# Patient Record
Sex: Female | Born: 1960 | Race: Black or African American | Hispanic: No | Marital: Married | Smoking: Never smoker
Health system: Southern US, Community
[De-identification: ages and names within clinical notes are randomized; demographics above are authoritative.]

## PROBLEM LIST (undated history)

## (undated) DIAGNOSIS — R002 Palpitations: Secondary | ICD-10-CM

## (undated) DIAGNOSIS — G4700 Insomnia, unspecified: Secondary | ICD-10-CM

## (undated) DIAGNOSIS — F513 Sleepwalking [somnambulism]: Secondary | ICD-10-CM

## (undated) DIAGNOSIS — E785 Hyperlipidemia, unspecified: Secondary | ICD-10-CM

## (undated) DIAGNOSIS — R011 Cardiac murmur, unspecified: Secondary | ICD-10-CM

## (undated) DIAGNOSIS — M1990 Unspecified osteoarthritis, unspecified site: Secondary | ICD-10-CM

## (undated) DIAGNOSIS — F419 Anxiety disorder, unspecified: Secondary | ICD-10-CM

## (undated) DIAGNOSIS — I341 Nonrheumatic mitral (valve) prolapse: Secondary | ICD-10-CM

## (undated) DIAGNOSIS — H269 Unspecified cataract: Secondary | ICD-10-CM

## (undated) HISTORY — DX: Unspecified osteoarthritis, unspecified site: M19.90

## (undated) HISTORY — DX: Cardiac murmur, unspecified: R01.1

## (undated) HISTORY — DX: Palpitations: R00.2

## (undated) HISTORY — DX: Unspecified cataract: H26.9

## (undated) HISTORY — DX: Insomnia, unspecified: G47.00

## (undated) HISTORY — DX: Nonrheumatic mitral (valve) prolapse: I34.1

## (undated) HISTORY — DX: Hyperlipidemia, unspecified: E78.5

## (undated) HISTORY — PX: FOOT SURGERY: SHX648

## (undated) HISTORY — DX: Anxiety disorder, unspecified: F41.9

## (undated) HISTORY — DX: Sleepwalking (somnambulism): F51.3

---

## 1998-06-29 ENCOUNTER — Other Ambulatory Visit: Admission: RE | Admit: 1998-06-29 | Discharge: 1998-06-29 | Payer: Self-pay | Admitting: Obstetrics and Gynecology

## 1999-07-01 ENCOUNTER — Other Ambulatory Visit: Admission: RE | Admit: 1999-07-01 | Discharge: 1999-07-01 | Payer: Self-pay | Admitting: Obstetrics and Gynecology

## 2000-07-31 ENCOUNTER — Other Ambulatory Visit: Admission: RE | Admit: 2000-07-31 | Discharge: 2000-07-31 | Payer: Self-pay | Admitting: Obstetrics and Gynecology

## 2001-04-18 ENCOUNTER — Ambulatory Visit (HOSPITAL_COMMUNITY): Admission: RE | Admit: 2001-04-18 | Discharge: 2001-04-18 | Payer: Self-pay | Admitting: Obstetrics and Gynecology

## 2001-04-18 ENCOUNTER — Encounter: Payer: Self-pay | Admitting: Obstetrics and Gynecology

## 2002-10-24 ENCOUNTER — Other Ambulatory Visit: Admission: RE | Admit: 2002-10-24 | Discharge: 2002-10-24 | Payer: Self-pay | Admitting: Obstetrics and Gynecology

## 2003-11-20 ENCOUNTER — Ambulatory Visit (HOSPITAL_COMMUNITY): Admission: RE | Admit: 2003-11-20 | Discharge: 2003-11-20 | Payer: Self-pay | Admitting: Internal Medicine

## 2004-11-29 ENCOUNTER — Other Ambulatory Visit: Admission: RE | Admit: 2004-11-29 | Discharge: 2004-11-29 | Payer: Self-pay | Admitting: Internal Medicine

## 2004-11-30 ENCOUNTER — Encounter: Admission: RE | Admit: 2004-11-30 | Discharge: 2004-11-30 | Payer: Self-pay | Admitting: Internal Medicine

## 2004-11-30 ENCOUNTER — Ambulatory Visit (HOSPITAL_COMMUNITY): Admission: RE | Admit: 2004-11-30 | Discharge: 2004-11-30 | Payer: Self-pay | Admitting: Internal Medicine

## 2005-01-12 ENCOUNTER — Encounter: Admission: RE | Admit: 2005-01-12 | Discharge: 2005-01-12 | Payer: Self-pay | Admitting: Internal Medicine

## 2005-12-07 ENCOUNTER — Ambulatory Visit (HOSPITAL_COMMUNITY): Admission: RE | Admit: 2005-12-07 | Discharge: 2005-12-07 | Payer: Self-pay | Admitting: Internal Medicine

## 2006-02-10 ENCOUNTER — Other Ambulatory Visit: Admission: RE | Admit: 2006-02-10 | Discharge: 2006-02-10 | Payer: Self-pay | Admitting: Internal Medicine

## 2006-05-09 ENCOUNTER — Other Ambulatory Visit: Admission: RE | Admit: 2006-05-09 | Discharge: 2006-05-09 | Payer: Self-pay | Admitting: Internal Medicine

## 2006-12-08 ENCOUNTER — Ambulatory Visit (HOSPITAL_COMMUNITY): Admission: RE | Admit: 2006-12-08 | Discharge: 2006-12-08 | Payer: Self-pay | Admitting: Internal Medicine

## 2007-12-13 HISTORY — PX: SHOULDER SURGERY: SHX246

## 2008-01-25 ENCOUNTER — Ambulatory Visit (HOSPITAL_COMMUNITY): Admission: RE | Admit: 2008-01-25 | Discharge: 2008-01-25 | Payer: Self-pay | Admitting: Obstetrics and Gynecology

## 2008-01-29 ENCOUNTER — Ambulatory Visit: Payer: Self-pay | Admitting: Sports Medicine

## 2008-01-29 DIAGNOSIS — M719 Bursopathy, unspecified: Secondary | ICD-10-CM

## 2008-01-29 DIAGNOSIS — M67919 Unspecified disorder of synovium and tendon, unspecified shoulder: Secondary | ICD-10-CM | POA: Insufficient documentation

## 2008-01-29 DIAGNOSIS — M12819 Other specific arthropathies, not elsewhere classified, unspecified shoulder: Secondary | ICD-10-CM

## 2008-01-30 ENCOUNTER — Encounter: Payer: Self-pay | Admitting: Sports Medicine

## 2009-01-26 ENCOUNTER — Ambulatory Visit (HOSPITAL_COMMUNITY): Admission: RE | Admit: 2009-01-26 | Discharge: 2009-01-26 | Payer: Self-pay | Admitting: Obstetrics and Gynecology

## 2009-05-06 ENCOUNTER — Encounter (INDEPENDENT_AMBULATORY_CARE_PROVIDER_SITE_OTHER): Payer: Self-pay | Admitting: *Deleted

## 2009-05-25 ENCOUNTER — Ambulatory Visit: Payer: Self-pay | Admitting: Family Medicine

## 2009-05-25 DIAGNOSIS — F411 Generalized anxiety disorder: Secondary | ICD-10-CM | POA: Insufficient documentation

## 2009-05-25 DIAGNOSIS — G478 Other sleep disorders: Secondary | ICD-10-CM | POA: Insufficient documentation

## 2009-05-25 DIAGNOSIS — M25579 Pain in unspecified ankle and joints of unspecified foot: Secondary | ICD-10-CM

## 2009-07-22 ENCOUNTER — Ambulatory Visit: Payer: Self-pay | Admitting: Family Medicine

## 2009-07-22 ENCOUNTER — Other Ambulatory Visit: Admission: RE | Admit: 2009-07-22 | Discharge: 2009-07-22 | Payer: Self-pay | Admitting: Family Medicine

## 2009-07-22 ENCOUNTER — Encounter: Payer: Self-pay | Admitting: Family Medicine

## 2009-07-22 DIAGNOSIS — B373 Candidiasis of vulva and vagina: Secondary | ICD-10-CM

## 2009-07-22 LAB — CONVERTED CEMR LAB: Whiff Test: NEGATIVE

## 2009-07-23 ENCOUNTER — Telehealth (INDEPENDENT_AMBULATORY_CARE_PROVIDER_SITE_OTHER): Payer: Self-pay | Admitting: *Deleted

## 2009-07-23 LAB — CONVERTED CEMR LAB
ALT: 15 units/L (ref 0–35)
AST: 16 units/L (ref 0–37)
Albumin: 3.7 g/dL (ref 3.5–5.2)
Alkaline Phosphatase: 53 units/L (ref 39–117)
BUN: 11 mg/dL (ref 6–23)
Basophils Absolute: 0 10*3/uL (ref 0.0–0.1)
Basophils Relative: 0.5 % (ref 0.0–3.0)
Bilirubin, Direct: 0 mg/dL (ref 0.0–0.3)
CO2: 27 meq/L (ref 19–32)
Calcium: 9.3 mg/dL (ref 8.4–10.5)
Chloride: 109 meq/L (ref 96–112)
Cholesterol: 202 mg/dL — ABNORMAL HIGH (ref 0–200)
Creatinine, Ser: 0.8 mg/dL (ref 0.4–1.2)
Direct LDL: 150.3 mg/dL
Eosinophils Absolute: 0.1 10*3/uL (ref 0.0–0.7)
Eosinophils Relative: 2 % (ref 0.0–5.0)
GFR calc non Af Amer: 98.38 mL/min (ref >60)
Glucose, Bld: 80 mg/dL (ref 70–99)
HCT: 37.2 % (ref 36.0–46.0)
HDL: 51.3 mg/dL (ref >39.00)
Hemoglobin: 12.2 g/dL (ref 12.0–15.0)
Lymphocytes Relative: 30.7 % (ref 12.0–46.0)
Lymphs Abs: 1.2 10*3/uL (ref 0.7–4.0)
MCHC: 32.9 g/dL (ref 30.0–36.0)
MCV: 86.1 fL (ref 78.0–100.0)
Monocytes Absolute: 0.3 10*3/uL (ref 0.1–1.0)
Monocytes Relative: 8.7 % (ref 3.0–12.0)
Neutro Abs: 2.4 10*3/uL (ref 1.4–7.7)
Neutrophils Relative %: 58.1 % (ref 43.0–77.0)
Platelets: 249 10*3/uL (ref 150.0–400.0)
Potassium: 4.4 meq/L (ref 3.5–5.1)
RBC: 4.33 M/uL (ref 3.87–5.11)
RDW: 15.5 % — ABNORMAL HIGH (ref 11.5–14.6)
Sodium: 140 meq/L (ref 135–145)
TSH: 1.42 microintl units/mL (ref 0.35–5.50)
Total Bilirubin: 0.5 mg/dL (ref 0.3–1.2)
Total CHOL/HDL Ratio: 4
Total Protein: 7.1 g/dL (ref 6.0–8.3)
Triglycerides: 49 mg/dL (ref 0.0–149.0)
VLDL: 9.8 mg/dL (ref 0.0–40.0)
WBC: 4 10*3/uL — ABNORMAL LOW (ref 4.5–10.5)

## 2009-07-24 ENCOUNTER — Telehealth (INDEPENDENT_AMBULATORY_CARE_PROVIDER_SITE_OTHER): Payer: Self-pay | Admitting: *Deleted

## 2009-07-28 ENCOUNTER — Encounter (INDEPENDENT_AMBULATORY_CARE_PROVIDER_SITE_OTHER): Payer: Self-pay | Admitting: *Deleted

## 2009-09-29 ENCOUNTER — Telehealth (INDEPENDENT_AMBULATORY_CARE_PROVIDER_SITE_OTHER): Payer: Self-pay | Admitting: *Deleted

## 2009-10-23 ENCOUNTER — Telehealth (INDEPENDENT_AMBULATORY_CARE_PROVIDER_SITE_OTHER): Payer: Self-pay | Admitting: *Deleted

## 2009-12-28 ENCOUNTER — Ambulatory Visit: Payer: Self-pay | Admitting: Family

## 2009-12-28 DIAGNOSIS — H6690 Otitis media, unspecified, unspecified ear: Secondary | ICD-10-CM | POA: Insufficient documentation

## 2009-12-28 DIAGNOSIS — J329 Chronic sinusitis, unspecified: Secondary | ICD-10-CM | POA: Insufficient documentation

## 2010-02-05 ENCOUNTER — Ambulatory Visit (HOSPITAL_COMMUNITY): Admission: RE | Admit: 2010-02-05 | Discharge: 2010-02-05 | Payer: Self-pay | Admitting: Family Medicine

## 2010-02-05 LAB — HM MAMMOGRAPHY: HM Mammogram: NORMAL

## 2010-05-17 ENCOUNTER — Telehealth (INDEPENDENT_AMBULATORY_CARE_PROVIDER_SITE_OTHER): Payer: Self-pay | Admitting: *Deleted

## 2010-05-17 ENCOUNTER — Ambulatory Visit: Payer: Self-pay | Admitting: Family Medicine

## 2010-05-17 DIAGNOSIS — R209 Unspecified disturbances of skin sensation: Secondary | ICD-10-CM

## 2010-07-23 ENCOUNTER — Ambulatory Visit: Payer: Self-pay | Admitting: Family Medicine

## 2010-07-23 DIAGNOSIS — H9190 Unspecified hearing loss, unspecified ear: Secondary | ICD-10-CM | POA: Insufficient documentation

## 2010-07-23 DIAGNOSIS — K219 Gastro-esophageal reflux disease without esophagitis: Secondary | ICD-10-CM | POA: Insufficient documentation

## 2010-07-27 LAB — CONVERTED CEMR LAB
ALT: 10 units/L (ref 0–35)
AST: 16 units/L (ref 0–37)
Albumin: 3.7 g/dL (ref 3.5–5.2)
Basophils Absolute: 0 10*3/uL (ref 0.0–0.1)
Chloride: 110 meq/L (ref 96–112)
Eosinophils Relative: 1.8 % (ref 0.0–5.0)
GFR calc non Af Amer: 108.89 mL/min (ref >60)
Glucose, Bld: 77 mg/dL (ref 70–99)
HCT: 33 % — ABNORMAL LOW (ref 36.0–46.0)
HDL: 60.5 mg/dL (ref >39.00)
Hemoglobin: 10.7 g/dL — ABNORMAL LOW (ref 12.0–15.0)
Lymphs Abs: 1.2 10*3/uL (ref 0.7–4.0)
MCV: 82.2 fL (ref 78.0–100.0)
Monocytes Relative: 7.5 % (ref 3.0–12.0)
Neutro Abs: 2.9 10*3/uL (ref 1.4–7.7)
Potassium: 4.5 meq/L (ref 3.5–5.1)
RDW: 18.2 % — ABNORMAL HIGH (ref 11.5–14.6)
Sodium: 143 meq/L (ref 135–145)
TSH: 1.9 microintl units/mL (ref 0.35–5.50)

## 2010-07-28 ENCOUNTER — Telehealth (INDEPENDENT_AMBULATORY_CARE_PROVIDER_SITE_OTHER): Payer: Self-pay | Admitting: *Deleted

## 2010-08-02 ENCOUNTER — Encounter: Payer: Self-pay | Admitting: Family Medicine

## 2010-08-27 ENCOUNTER — Ambulatory Visit: Payer: Self-pay | Admitting: Family Medicine

## 2010-08-27 ENCOUNTER — Other Ambulatory Visit: Admission: RE | Admit: 2010-08-27 | Discharge: 2010-08-27 | Payer: Self-pay | Admitting: Family Medicine

## 2010-08-27 DIAGNOSIS — D485 Neoplasm of uncertain behavior of skin: Secondary | ICD-10-CM

## 2010-08-27 DIAGNOSIS — E785 Hyperlipidemia, unspecified: Secondary | ICD-10-CM

## 2010-09-01 ENCOUNTER — Telehealth: Payer: Self-pay | Admitting: Family Medicine

## 2010-10-12 ENCOUNTER — Ambulatory Visit: Payer: Self-pay | Admitting: Family Medicine

## 2010-10-13 LAB — CONVERTED CEMR LAB
ALT: 12 units/L (ref 0–35)
AST: 17 units/L (ref 0–37)
Albumin: 3.6 g/dL (ref 3.5–5.2)

## 2011-01-02 ENCOUNTER — Encounter: Payer: Self-pay | Admitting: Internal Medicine

## 2011-01-13 NOTE — Assessment & Plan Note (Signed)
Summary: cpx - lab/cbs--Rm 16   Vital Signs:  Patient profile:   50 year old female LMP:     07/19/2010 Height:      66 inches Weight:      203 pounds BMI:     32.88 Temp:     98.1 degrees F oral Pulse rate:   72 / minute Pulse rhythm:   regular Resp:     16 per minute BP sitting:   128 / 80  (left arm) Cuff size:   regular  Vitals Entered By: Mervin Kung CMA Duncan Dull) (July 23, 2010 8:27 AM)  Does patient need assistance? Functional Status Shopping Comments Has appt with Neuro next month for right side facial numbness. Pt states she had "profuse vaginal discharge" when she used Metrogel.  Nicki Guadalajara Fergerson CMA Duncan Dull)  July 23, 2010 8:34 AM  LMP (date): 07/19/2010     Enter LMP: 07/19/2010 Last PAP Result NEGATIVE FOR INTRAEPITHELIAL LESIONS OR MALIGNANCY.   History of Present Illness: 50 yo woman here today for CPE.  currently menstruating- no pap.  1) facial numbness- no additional episodes, having some numbness in fingertips.  has neuro appt pending.  2) abd pain- reports sxs at night, 'it's like a hunger pain and my stomach starts churning'.  took prevacid for 4 days and sxs improved.  no N/V/D.  Preventive Screening-Counseling & Management  Alcohol-Tobacco     Alcohol drinks/day: <1     Smoking Status: never  Caffeine-Diet-Exercise     Does Patient Exercise: no      Sexual History:  currently monogamous.        Drug Use:  never.    Current Medications (verified): 1)  Alprazolam 0.5 Mg Tabs (Alprazolam) .Marland Kitchen.. 1 Tab Three Times A Day As Needed.  Allergies: 1)  ! Macrobid  Past History:  Past Medical History: Last updated: 05/25/2009 MVP Insomnia Sleep walking  Family History: Last updated: 05/25/2009 Both parents with arthritis. No history of Rheumatological disease. No colon cancer, breast cancer  F - CAD, quadruple bypass  Social History: Last updated: 05/25/2009 Married 1 child, 61 works in food services No alcohol No  drugs  Social History: Does Patient Exercise:  no  Review of Systems       The patient complains of decreased hearing and abdominal pain.  The patient denies anorexia, fever, weight loss, weight gain, vision loss, hoarseness, chest pain, syncope, dyspnea on exertion, peripheral edema, prolonged cough, headaches, melena, hematochezia, severe indigestion/heartburn, hematuria, suspicious skin lesions, depression, abnormal bleeding, enlarged lymph nodes, and breast masses.         feels her hearing loss is related to noisy work environment- would like audiology evaluation  Physical Exam  General:  Well-developed,well-nourished,in no acute distress; alert,appropriate and cooperative throughout examination Head:  Normocephalic and atraumatic without obvious abnormalities. No apparent alopecia or balding.  face is symmetric Eyes:  PERRL, EOMI, fundi WNL Ears:  External ear exam shows no significant lesions or deformities.  Otoscopic examination reveals clear canals, tympanic membranes are intact bilaterally without bulging, retraction, inflammation or discharge. Hearing is grossly normal bilaterally. Nose:  External nasal examination shows no deformity or inflammation. Nasal mucosa are pink and moist without lesions or exudates. Mouth:  Oral mucosa and oropharynx without lesions or exudates.  Teeth in good repair. Neck:  No deformities, masses, or tenderness noted.  no bruits Lungs:  Normal respiratory effort, chest expands symmetrically. Lungs are clear to auscultation, no crackles or wheezes. Heart:  Normal rate and regular rhythm.  S1 and S2 normal without gallop, murmur, click, rub or other extra sounds. Abdomen:  Bowel sounds positive,abdomen soft and non-tender without masses, organomegaly or hernias noted. Pulses:  +2 carotid, radial, DP Extremities:  No clubbing, cyanosis, edema, or deformity noted with normal full range of motion of all joints.   Neurologic:  alert & oriented X3, cranial  nerves II-XII intact, strength normal in all extremities, sensation intact to light touch, gait normal, and DTRs symmetrical and normal.  sensation symmetric on face Skin:  Intact without suspicious lesions or rashes.  Cervical Nodes:  No lymphadenopathy noted Axillary Nodes:  No palpable lymphadenopathy Psych:  Cognition and judgment appear intact. Alert and cooperative with normal attention span and concentration. No apparent delusions, illusions, hallucinations   Impression & Recommendations:  Problem # 1:  PHYSICAL EXAMINATION (ICD-V70.0) Assessment Unchanged pt's PE WNL.  will defer pap and breast exam due to pt's current menses.  check labs.  UTD on mammogram.  anticipatory guidance provided. Orders: Venipuncture (82956) TLB-Lipid Panel (80061-LIPID) TLB-BMP (Basic Metabolic Panel-BMET) (80048-METABOL) TLB-CBC Platelet - w/Differential (85025-CBCD) TLB-Hepatic/Liver Function Pnl (80076-HEPATIC) TLB-TSH (Thyroid Stimulating Hormone) (84443-TSH) T-Vitamin D (25-Hydroxy) (21308-65784) Specimen Handling (69629)  Problem # 2:  GERD (ICD-530.81) Assessment: New start OTC Prilosec daily.  reviewed lifestyle modifications.  Problem # 3:  UNSPECIFIED HEARING LOSS (ICD-389.9) Assessment: New  pt reports this is noise related.  would like formal evaluation.  Orders: Audiology (Audio)  Complete Medication List: 1)  Alprazolam 0.5 Mg Tabs (Alprazolam) .Marland Kitchen.. 1 tab three times a day as needed.  Patient Instructions: 1)  Please schedule your pap and breast exam at your convenience 2)  Start Prilosec (over the counter) for the acid reflux symptoms 3)  Try and get regular exercise 4)  We'll notify you of your lab results 5)  Call with any questions or concerns 6)  Good Luck w/ Back to School!!  (you'll get through it!)  Current Allergies (reviewed today): ! MACROBID     Vital Signs:  Patient Profile:   50 year old female LMP:     07/19/2010 Height:     66 inches Weight:       203 pounds BMI:     32.88 Temp:     98.1 degrees F oral Pulse rate:   72 / minute Pulse rhythm:   regular Resp:     16 per minute BP sitting:   128 / 80 Cuff size:   regular

## 2011-01-13 NOTE — Progress Notes (Signed)
Summary: labs-  Phone Note Outgoing Call   Call placed by: [name] CMA,  July 28, 2010 11:52 AM Call placed to: Patient Summary of Call: pt's LDL is stable at 150- since it didn't improve w/ diet and exercise needs to start Simvastatin 20mg  at bedtime and recheck LFTs in 6-8 weeks.  mildly anemic- likely due to periods.  should start OTC iron supplement.  rest of labs look good   Follow-up for Phone Call        left message on machine to call back to office.    Signed by Lucious Groves CMA on 07/27/2010 at 2:28 PM  left message on machine .........Marland KitchenDoristine Devoid CMA  August 03, 2010 1:49 PM   spoke w/ patient aware of labs and would like to think about starting medication and she will contact us back to let us know..........Marland KitchenDoristine Devoid CMA  August 04, 2010 8:39 AM

## 2011-01-13 NOTE — Assessment & Plan Note (Signed)
Summary: episode of numbness right side of face/alr   Vital Signs:  Patient profile:   50 year old female Height:      66 inches Weight:      202 pounds BMI:     32.72 Pulse rate:   76 / minute BP sitting:   130 / 70  (left arm)  Vitals Entered By: [name] (May 17, 2010 11:04 AM) CC: R side of cheek and nose numbness and heat feeling    History of Present Illness: 50 yo woman here today b/c she had an episode of facial numbness over the weekend.  yesterday was doing yardwork and later in the day felt facial coolness/tingling.  had used chemicals in the yard.  describes the feeling as 'when the novocaine is wearing off from the dentist'.  only area involved is lateral to R nostril and on cheek bone.  sxs are improved today.  no facial droop/slurred speech.   no focal weakness of arms/legs.  no motor sxs.  Current Medications (verified): 1)  Alprazolam 0.5 Mg Tabs (Alprazolam) .Marland Kitchen.. 1 Tab Three Times A Day  Allergies (verified): 1)  ! Macrobid  Past History:  Past Medical History: Last updated: 05/25/2009 MVP Insomnia Sleep walking  Family History: Last updated: 05/25/2009 Both parents with arthritis. No history of Rheumatological disease. No colon cancer, breast cancer  F - CAD, quadruple bypass  Review of Systems      See HPI  Physical Exam  General:  Well-developed,well-nourished,in no acute distress; alert,appropriate and cooperative throughout examination Head:  Normocephalic and atraumatic without obvious abnormalities. No apparent alopecia or balding.  face is symmetric Eyes:  PERRL, EOMI Mouth:  tongue midline Neck:  No deformities, masses, or tenderness noted.  no bruits Lungs:  Normal respiratory effort, chest expands symmetrically. Lungs are clear to auscultation, no crackles or wheezes. Heart:  Normal rate and regular rhythm. S1 and S2 normal without gallop, murmur, click, rub or other extra sounds. Neurologic:  alert & oriented X3, cranial nerves  II-XII intact, strength normal in all extremities, sensation intact to light touch, gait normal, and DTRs symmetrical and normal.  sensation symmetric on face Skin:  Intact without suspicious lesions or rashes.    Impression & Recommendations:  Problem # 1:  FACIAL PARESTHESIA, RIGHT (ICD-782.0) Assessment New pt w/ improving tingling of R face lateral to nostril.  no abnormalities on neuro exam.  as pt's sxs are improving and they were strictly sensory it would be unlikely to be TIA/CVA.  ? whether pt rubbed her face w/ chemical on her hand causing tingling.  will refer to neuro for complete evaluation but pt reports she is leaving town this weekend and will be gone for 3 weeks- wants to have appt when she returns.  reviewed red flags that should prompt immediate attention.  pt aware. Orders: Neurology Referral (Neuro)  Complete Medication List: 1)  Alprazolam 0.5 Mg Tabs (Alprazolam) .Marland Kitchen.. 1 tab three times a day  Patient Instructions: 1)  Please schedule your physical for after Aug 11- do not eat before this appt 2)  Someone will call you with your neurology appt 3)  If you develop worsening numbness, weakness, facial droop, slurred speech, or any other concerns- please go to the ER 4)  HAPPY EARLY BIRTHDAY! Prescriptions: ALPRAZOLAM 0.5 MG TABS (ALPRAZOLAM) 1 tab three times a day  #90 x 0   Entered and Authorized by:   [name]   Signed by:   [name]  on 05/17/2010   Method used:   Print then Give to Patient   RxID:   [id number]

## 2011-01-13 NOTE — Progress Notes (Signed)
Summary: Pap results  Phone Note Outgoing Call   Call placed by: Jeremy Johann CMA,  September 01, 2010 8:43 AM Details for Reason: pap smear -normal.  please notify pt Summary of Call: Left message on machine to call back to office.    Signed by Lucious Groves CMA on 08/31/2010 at 4:18 PM  left message to call office ...........................Marland KitchenFelecia Deloach CMA  September 01, 2010 8:44 AM    Follow-up for Phone Call        Patient notified and had questions as to why her husband could not take simvastatin. I notified patient (after looking back) that it would interfere with his other meds, but note hers. Patient expressed understanding. Lucious Groves CMA  September 01, 2010 2:39 PM

## 2011-01-13 NOTE — Assessment & Plan Note (Signed)
Summary: pap only/cbs   Vital Signs:  Patient profile:   50 year old female Height:      66 inches Weight:      202 pounds Temp:     99.7 degrees F oral Pulse rate:   82 / minute BP sitting:   122 / 76  (left arm)  Vitals Entered By: Jeremy Johann CMA (August 27, 2010 3:12 PM) CC: pap   History of Present Illness: 50 yo woman here today for pap and breast exam.  complains of vaginal d/c- thinks it's yeast.  + itching, thick white d/c.  no odor.  pt also wants more information on cholesterol meds.  Current Medications (verified): 1)  Alprazolam 0.5 Mg Tabs (Alprazolam) .Marland Kitchen.. 1 Tab Three Times A Day As Needed. 2)  Diflucan 150 Mg Tabs (Fluconazole) .... Once Daily.  May Repeat in 3 Days If Sxs Persist. 3)  Simvastatin 20 Mg Tabs (Simvastatin) .Marland Kitchen.. 1 By Mouth At Bedtime  Allergies (verified): 1)  ! Macrobid  Past History:  Past Medical History: MVP Insomnia Sleep walking hyperlipidemia  Review of Systems      See HPI  Physical Exam  General:  Well-developed,well-nourished,in no acute distress; alert,appropriate and cooperative throughout examination Breasts:  No mass, nodules, thickening, tenderness, bulging, retraction, inflamation, nipple discharge or skin changes noted.   Rectal:  external hemorrhoid(s).   Genitalia:  Pelvic Exam:        External: normal female genitalia without lesions or masses- some redness and irritation        Vagina: normal without lesions or masses, + yeast on walls, thick D/C        Cervix: normal without lesions or masses        Adnexa: normal bimanual exam without masses or fullness        Uterus: normal by palpation        Pap smear: performed Skin:  hyperpigmented, irregular mole on L lateral foot.   ~1 cm   Impression & Recommendations:  Problem # 1:  CANDIDIASIS, VAGINAL (ICD-112.1) Assessment Unchanged pt w/ obvious yeast on pelvic exam.  start diflucan Her updated medication list for this problem includes:    Diflucan  150 Mg Tabs (Fluconazole) ..... Once daily.  may repeat in 3 days if sxs persist.  Problem # 2:  HYPERLIPIDEMIA (ICD-272.4) Assessment: New reviewed importance of risk reduction given family hx of CAD.  discussed simvastatin, side effects, and risks of MI and CVA w/ high cholesterol.  pt reports she will think about it- med sent to pharmacy. Her updated medication list for this problem includes:    Simvastatin 20 Mg Tabs (Simvastatin) .Marland Kitchen... 1 by mouth at bedtime  Problem # 3:  SCREENING FOR MALIGNANT NEOPLASM OF THE CERVIX (ICD-V76.2) Assessment: Unchanged pap collected.  Problem # 4:  NEOPLASM OF UNCERTAIN BEHAVIOR OF SKIN (ICD-238.2) Assessment: New given dark, irregular mole on foot encouraged pt to call Dr Joseph Art (derm).  pt in agreement.  Complete Medication List: 1)  Alprazolam 0.5 Mg Tabs (Alprazolam) .Marland Kitchen.. 1 tab three times a day as needed. 2)  Diflucan 150 Mg Tabs (Fluconazole) .... Once daily.  may repeat in 3 days if sxs persist. 3)  Simvastatin 20 Mg Tabs (Simvastatin) .Marland Kitchen.. 1 by mouth at bedtime  Patient Instructions: 1)  Schedule a lab visit 6-8 weeks after starting your cholesterol med to recheck your liver function 2)  Take the Diflucan as directed for yeast 3)  We'll notify you of your pap results 4)  Call Dr Joseph Art about the mole on your L foot 5)  You have finally finished your physical!!! Prescriptions: SIMVASTATIN 20 MG TABS (SIMVASTATIN) 1 by mouth at bedtime  #30 x 3   Entered and Authorized by:   [name]   Signed by:   [name] on 08/27/2010   Method used:   [facility] to        [id number]       [address]       [place], [place]  [address]       Ph: [phone] or [phone]       Fax: [phone]   RxID:   [id number] DIFLUCAN 150 MG TABS (FLUCONAZOLE) once daily.  may repeat in 3 days if sxs persist.  #2 x 0   Entered and Authorized by:   [name]   Signed by:    [name] on 08/27/2010   Method used:   [facility]        [facility] [id number]       [address]       [place], [place]  [address]       Ph: [phone] or [phone]       Fax: [phone]   RxID:   [id number]

## 2011-01-13 NOTE — Progress Notes (Signed)
Summary: office visit  Phone Note Call from Patient Call back at [phone]   Caller: Patient Summary of Call: pt called c/o episode of numbness right side of face yesterday, was out in the yard, sprayed some weedkiller. Denies any slurred speech or distortion of face. Pt says she feels fine now.  OV scheduled .Kandice Hams  May 17, 2010 10:21 AM  Initial call taken by: Kandice Hams,  May 17, 2010 10:21 AM

## 2011-01-13 NOTE — Assessment & Plan Note (Signed)
Summary: CONGESTION/COUGHING/KDC   Vital Signs:  Patient profile:   50 year old female Weight:      209 pounds Temp:     98.4 degrees F oral BP sitting:   126 / 74  (right arm)  Vitals Entered By: [name] (December 28, 2009 3:39 PM) CC: chills and cough ans some sinus drainage xwks worst at night   CC:  chills and cough ans some sinus drainage xwks worst at night.  History of Present Illness: Joanne Li is a 50 year old female who presents with c/o sinus congestion since thursday which is associated with "tightness" around my vocal cords at night.  +cough at night worse when laying flat.  Has felt chills, but has not taken her temp.  Has used Mucinex D with minimal improvement.   Allergies: 1)  ! Macrobid  Review of Systems       Tan nasal drainage, minimal sinus pressure.    Physical Exam  Ears:  L TM red retracted   Impression & Recommendations:  Problem # 1:  SINUSITIS (ICD-473.9)  Her updated medication list for this problem includes:    Amoxicillin 500 Mg Cap (Amoxicillin) .Marland Kitchen... Take 1 capsule by mouth three times a day x 10 days  Complete Medication List: 1)  Alprazolam 0.5 Mg Tabs (Alprazolam) .Marland Kitchen.. 1 tab three times a day 2)  Naproxen 500 Mg Tabs (Naproxen) .Marland Kitchen.. 1 tab by mouth two times a day x7-10 days and then as needed for pain.  take w/ food. 3)  Diflucan 150 Mg Tabs (Fluconazole) .... Once daily.  if symptoms continue after 3 days, take the 2nd pill 4)  Amoxicillin 500 Mg Cap (Amoxicillin) .... Take 1 capsule by mouth three times a day x 10 days  Patient Instructions: 1)   Call if you develop fever over 101, increasing sinus pressure, pain with eye movement, increased facial tenderness of swelling, or if you develop visual changes. Prescriptions: AMOXICILLIN 500 MG CAP (AMOXICILLIN) Take 1 capsule by mouth three times a day X 10 days  #30 x 0   Entered and Authorized by:   Lemont Fillers FNP   Signed by:   Lemont Fillers FNP on  12/28/2009   Method used:   [facility]        [facility] [id number]       [address]       [place], [place]  [address]       Ph: [phone] or [phone]       Fax: [phone]   RxID:   [id number]   Appended Document: CONGESTION/COUGHING/KDC     Allergies: 1)  ! Macrobid  Physical Exam  General:  Well-developed,well-nourished,in no acute distress; alert,appropriate and cooperative throughout examination Head:  Normocephalic and atraumatic without obvious abnormalities. No apparent alopecia or balding. Ears:  External ear exam shows no significant lesions or deformities.  Otoscopic examination reveals clear canals, tympanic membranes are intact bilaterally without bulging, retraction, inflammation or discharge. Hearing is grossly normal bilaterally. Mouth:  Oral mucosa and oropharynx without lesions or exudates.  Teeth in good repair. Neck:  No deformities, masses, or tenderness noted. Lungs:  Normal respiratory effort, chest expands symmetrically. Lungs are clear to auscultation, no crackles or wheezes. Heart:  Normal rate and regular rhythm. S1 and S2 normal without gallop, murmur, click, rub or other extra sounds.   Complete Medication List: 1)  Alprazolam 0.5 Mg Tabs (Alprazolam) .Marland KitchenMarland KitchenMarland Kitchen  1 tab three times a day 2)  Naproxen 500 Mg Tabs (Naproxen) .Marland Kitchen.. 1 tab by mouth two times a day x7-10 days and then as needed for pain.  take w/ food. 3)  Diflucan 150 Mg Tabs (Fluconazole) .... Once daily.  if symptoms continue after 3 days, take the 2nd pill 4)  Amoxicillin 500 Mg Cap (Amoxicillin) .... Take 1 capsule by mouth three times a day x 10 days 5)  Tussionex Pennkinetic Er 8-10 Mg/67ml Lqcr (Chlorpheniramine-hydrocodone) .... 5 ml every 12 hours as needed for severe cough Prescriptions: TUSSIONEX PENNKINETIC ER 8-10 MG/5ML LQCR (CHLORPHENIRAMINE-HYDROCODONE) 5 ml every 12 hours as needed for severe cough  #120 x 0   Entered and  Authorized by:   Lemont Fillers FNP   Signed by:   Lemont Fillers FNP on 12/28/2009   Method used:   Print then Give to Patient   RxID:   [id number]   Appended Document: CONGESTION/COUGHING/KDC     Allergies: 1)  ! Macrobid   Impression & Recommendations:  Problem # 1:  OTITIS MEDIA, LEFT (ICD-382.9) Assessment New Will treat with amoxicillin Her updated medication list for this problem includes:    Naproxen 500 Mg Tabs (Naproxen) .Marland Kitchen... 1 tab by mouth two times a day x7-10 days and then as needed for pain.  take w/ food.    Amoxicillin 500 Mg Cap (Amoxicillin) .Marland Kitchen... Take 1 capsule by mouth three times a day x 10 days  Complete Medication List: 1)  Alprazolam 0.5 Mg Tabs (Alprazolam) .Marland Kitchen.. 1 tab three times a day 2)  Naproxen 500 Mg Tabs (Naproxen) .Marland Kitchen.. 1 tab by mouth two times a day x7-10 days and then as needed for pain.  take w/ food. 3)  Diflucan 150 Mg Tabs (Fluconazole) .... Once daily.  if symptoms continue after 3 days, take the 2nd pill 4)  Amoxicillin 500 Mg Cap (Amoxicillin) .... Take 1 capsule by mouth three times a day x 10 days 5)  Tussionex Pennkinetic Er 8-10 Mg/33ml Lqcr (Chlorpheniramine-hydrocodone) .... 5 ml every 12 hours as needed for severe cough

## 2011-01-13 NOTE — Therapy (Signed)
Summary: Audiometry/[facility]- normal hearing  Audiometry/[facility]   Imported By: Lanelle Bal 08/25/2010 12:58:08  _____________________________________________________________________  External Attachment:    Type:   Image     Comment:   External Document

## 2011-01-21 ENCOUNTER — Other Ambulatory Visit: Payer: Self-pay

## 2011-01-21 DIAGNOSIS — Z1231 Encounter for screening mammogram for malignant neoplasm of breast: Secondary | ICD-10-CM

## 2011-02-07 ENCOUNTER — Ambulatory Visit (HOSPITAL_COMMUNITY)
Admission: RE | Admit: 2011-02-07 | Discharge: 2011-02-07 | Disposition: A | Discharge status: Home / Self Care | Payer: Managed Care, Other (non HMO) | Source: Ambulatory Visit | Attending: Family Medicine | Admitting: Family Medicine

## 2011-02-07 ENCOUNTER — Encounter (HOSPITAL_COMMUNITY): Payer: Self-pay

## 2011-02-07 DIAGNOSIS — Z1231 Encounter for screening mammogram for malignant neoplasm of breast: Secondary | ICD-10-CM | POA: Insufficient documentation

## 2011-02-15 ENCOUNTER — Telehealth: Payer: Self-pay | Admitting: Family Medicine

## 2011-02-22 NOTE — Progress Notes (Signed)
Summary: Refill--Simvastatin  Phone Note Refill Request   Refills Requested: Medication #1:  SIMVASTATIN 20 MG TABS 1 by mouth at bedtime. [facility] [address] - [facility] 30 day supply  & 90 day supply called in to [facility] = patient husband threw bottles away  Initial call taken by: Okey Regal [place],  February 15, 2011 10:32 AM  Follow-up for Phone Call        No call back number was given, left message on machine at home to call back to office. Lucious Groves CMA  February 15, 2011 10:45 AM   Lm to call back to office. Lucious Groves CMA  February 16, 2011 4:27 PM   Left message on machine to call back to office. Lucious Groves CMA  February 17, 2011 10:52 AM   Additional Follow-up for Phone Call Additional follow up Details #1::        Patient aware prescription was sent. Lucious Groves CMA  February 17, 2011 3:53 PM     Prescriptions: SIMVASTATIN 20 MG TABS (SIMVASTATIN) 1 by mouth at bedtime  #90 x 0   Entered by:   Lucious Groves CMA   Authorized by:   [name]   Signed by:   Lucious Groves CMA on 02/15/2011   Method used:   Printed then faxed to ...       [facility] (mail-order)             , [place]         Ph: [phone]       Fax: [phone]   RxID:   [id number] SIMVASTATIN 20 MG TABS (SIMVASTATIN) 1 by mouth at bedtime  #30 x 0   Entered by:   Lucious Groves CMA   Authorized by:   [name]   Signed by:   Lucious Groves CMA on 02/15/2011   Method used:   [facility]  [address] [id number]       [address]       [place], [place]  [address]       Ph: [phone] or [phone]       Fax: [phone]   RxID:   [id number]

## 2011-04-08 ENCOUNTER — Telehealth: Payer: Self-pay | Admitting: Family Medicine

## 2011-04-08 NOTE — Telephone Encounter (Signed)
Patient's prescription for Simvastatin has 7 weeks left on it with no refills---will patient need to see Dr Beverely Low before the seven weeks is up?  Or can she call in six weeks to get another refill??   NOTE: if she needs to be seen, she will need appt before 05/13/2011   (had cpx last year on 07/23/2010 but has changed insurance companies)

## 2011-04-08 NOTE — Telephone Encounter (Signed)
Pt notified and appt made

## 2011-04-08 NOTE — Telephone Encounter (Signed)
The medicine can be refilled w/out an appt but she should have her regular physical this summer as planned.

## 2011-05-10 ENCOUNTER — Other Ambulatory Visit: Payer: Self-pay | Admitting: Family Medicine

## 2011-06-03 ENCOUNTER — Encounter: Payer: Self-pay | Admitting: Family Medicine

## 2011-06-25 ENCOUNTER — Inpatient Hospital Stay (INDEPENDENT_AMBULATORY_CARE_PROVIDER_SITE_OTHER)
Admission: RE | Admit: 2011-06-25 | Discharge: 2011-06-25 | Disposition: A | Discharge status: Home / Self Care | Payer: Managed Care, Other (non HMO) | Source: Ambulatory Visit | Attending: Family Medicine | Admitting: Family Medicine

## 2011-06-25 DIAGNOSIS — R6889 Other general symptoms and signs: Secondary | ICD-10-CM

## 2011-07-25 ENCOUNTER — Ambulatory Visit (INDEPENDENT_AMBULATORY_CARE_PROVIDER_SITE_OTHER): Payer: Managed Care, Other (non HMO) | Admitting: Family Medicine

## 2011-07-25 ENCOUNTER — Encounter: Payer: Self-pay | Admitting: Gastroenterology

## 2011-07-25 ENCOUNTER — Encounter: Payer: Self-pay | Admitting: Family Medicine

## 2011-07-25 ENCOUNTER — Telehealth: Payer: Self-pay

## 2011-07-25 ENCOUNTER — Other Ambulatory Visit (HOSPITAL_COMMUNITY)
Admission: RE | Admit: 2011-07-25 | Discharge: 2011-07-25 | Disposition: A | Discharge status: Home / Self Care | Payer: Managed Care, Other (non HMO) | Source: Ambulatory Visit | Attending: Family Medicine | Admitting: Family Medicine

## 2011-07-25 DIAGNOSIS — E785 Hyperlipidemia, unspecified: Secondary | ICD-10-CM

## 2011-07-25 DIAGNOSIS — Z0000 Encounter for general adult medical examination without abnormal findings: Secondary | ICD-10-CM | POA: Insufficient documentation

## 2011-07-25 DIAGNOSIS — Z01419 Encounter for gynecological examination (general) (routine) without abnormal findings: Secondary | ICD-10-CM | POA: Insufficient documentation

## 2011-07-25 DIAGNOSIS — G479 Sleep disorder, unspecified: Secondary | ICD-10-CM

## 2011-07-25 DIAGNOSIS — Z124 Encounter for screening for malignant neoplasm of cervix: Secondary | ICD-10-CM | POA: Insufficient documentation

## 2011-07-25 DIAGNOSIS — Z1211 Encounter for screening for malignant neoplasm of colon: Secondary | ICD-10-CM | POA: Insufficient documentation

## 2011-07-25 LAB — LIPID PANEL
Cholesterol: 164 mg/dL (ref 0–200)
HDL: 61.7 mg/dL (ref >39.00)
LDL Cholesterol: 94 mg/dL (ref 0–99)
Total CHOL/HDL Ratio: 3
Triglycerides: 44 mg/dL (ref 0.0–149.0)
VLDL: 8.8 mg/dL (ref 0.0–40.0)

## 2011-07-25 LAB — CBC WITH DIFFERENTIAL/PLATELET
Basophils Absolute: 0 10*3/uL (ref 0.0–0.1)
Basophils Relative: 0.7 % (ref 0.0–3.0)
Eosinophils Absolute: 0.1 10*3/uL (ref 0.0–0.7)
Eosinophils Relative: 2.7 % (ref 0.0–5.0)
HCT: 36.5 % (ref 36.0–46.0)
Hemoglobin: 11.8 g/dL — ABNORMAL LOW (ref 12.0–15.0)
Lymphocytes Relative: 26.5 % (ref 12.0–46.0)
Lymphs Abs: 1.2 10*3/uL (ref 0.7–4.0)
MCHC: 32.4 g/dL (ref 30.0–36.0)
MCV: 86.7 fl (ref 78.0–100.0)
Monocytes Absolute: 0.4 10*3/uL (ref 0.1–1.0)
Monocytes Relative: 9.9 % (ref 3.0–12.0)
Neutro Abs: 2.6 10*3/uL (ref 1.4–7.7)
Neutrophils Relative %: 60.2 % (ref 43.0–77.0)
Platelets: 245 10*3/uL (ref 150.0–400.0)
RBC: 4.21 Mil/uL (ref 3.87–5.11)
RDW: 15.8 % — ABNORMAL HIGH (ref 11.5–14.6)
WBC: 4.4 10*3/uL — ABNORMAL LOW (ref 4.5–10.5)

## 2011-07-25 LAB — BASIC METABOLIC PANEL
BUN: 13 mg/dL (ref 6–23)
CO2: 26 mEq/L (ref 19–32)
Calcium: 9 mg/dL (ref 8.4–10.5)
Chloride: 105 mEq/L (ref 96–112)
Creatinine, Ser: 0.9 mg/dL (ref 0.4–1.2)
GFR: 88.57 mL/min (ref >60.00)
Glucose, Bld: 85 mg/dL (ref 70–99)
Potassium: 4.1 mEq/L (ref 3.5–5.1)
Sodium: 138 mEq/L (ref 135–145)

## 2011-07-25 LAB — HEPATIC FUNCTION PANEL
ALT: 16 U/L (ref 0–35)
AST: 19 U/L (ref 0–37)
Albumin: 4.1 g/dL (ref 3.5–5.2)
Alkaline Phosphatase: 53 U/L (ref 39–117)
Bilirubin, Direct: 0.1 mg/dL (ref 0.0–0.3)
Total Bilirubin: 0.5 mg/dL (ref 0.3–1.2)
Total Protein: 7.3 g/dL (ref 6.0–8.3)

## 2011-07-25 NOTE — Telephone Encounter (Signed)
Labs mailed

## 2011-07-25 NOTE — Progress Notes (Signed)
  Subjective:    Patient ID: Joanne Li, female    DOB: 23-Sep-1961, 50 y.o.   MRN: [id number]  HPI CPE- Has never had colonoscopy.  UTD on mammo, due for pap today.  Sleep walking- pt reports she is sleep talking, walking.  'no one wants to sleep w/ me when we're on vacation'.  Having trouble distinguishing between asleep and awake.   Review of Systems Patient reports no vision/ hearing changes, adenopathy,fever, weight change,  persistant/recurrent hoarseness , swallowing issues, chest pain, palpitations, edema, persistant/recurrent cough, hemoptysis, dyspnea (rest/exertional/paroxysmal nocturnal), gastrointestinal bleeding (melena, rectal bleeding), abdominal pain, significant heartburn, bowel changes, GU symptoms (dysuria, hematuria, incontinence), Gyn symptoms (abnormal  bleeding, pain),  syncope, focal weakness, memory loss, numbness & tingling, skin/hair/nail changes, abnormal bruising or bleeding, anxiety, or depression.     Objective:   Physical Exam  General Appearance:    Alert, cooperative, no distress, appears stated age  Head:    Normocephalic, without obvious abnormality, atraumatic  Eyes:    PERRL, conjunctiva/corneas clear, EOM's intact, fundi    benign, both eyes  Ears:    Normal TM's and external ear canals, both ears  Nose:   Nares normal, septum midline, mucosa normal, no drainage    or sinus tenderness  Throat:   Lips, mucosa, and tongue normal; teeth and gums normal  Neck:   Supple, symmetrical, trachea midline, no adenopathy;    Thyroid: no enlargement/tenderness/nodules  Back:     Symmetric, no curvature, ROM normal, no CVA tenderness  Lungs:     Clear to auscultation bilaterally, respirations unlabored  Chest Wall:    No tenderness or deformity   Heart:    Regular rate and rhythm, S1 and S2 normal, no murmur, rub   or gallop  Breast Exam:    No tenderness, masses, or nipple abnormality  Abdomen:     Soft, non-tender, bowel sounds active all four quadrants,    no masses, no organomegaly  Genitalia:    External genitalia normal, cervix normal in appearance, no CMT, uterus in normal size and position, adnexa w/out mass or tenderness, mucosa pink and moist, no lesions or discharge present  Rectal:    Normal external appearance  Extremities:   Extremities normal, atraumatic, no cyanosis or edema  Pulses:   2+ and symmetric all extremities  Skin:   Skin color, texture, turgor normal, no rashes or lesions  Lymph nodes:   Cervical, supraclavicular, and axillary nodes normal  Neurologic:   CNII-XII intact, normal strength, sensation and reflexes    throughout          Assessment & Plan:

## 2011-07-25 NOTE — Telephone Encounter (Signed)
Message copied by Beverely Low on Mon Jul 25, 2011  3:00 PM ------      Message from: Sheliah Hatch      Created: Mon Jul 25, 2011  2:48 PM       Labs look great!  Keep up the good work!

## 2011-07-25 NOTE — Patient Instructions (Signed)
Follow up in 6 months to recheck cholesterol- do not eat before this appt We'll notify you of your lab results We'll call you with your GI and sleep appts Try and make healthy food choices and get regular exercise Call with any questions or concerns Good luck with work!

## 2011-07-27 ENCOUNTER — Telehealth: Payer: Self-pay

## 2011-07-27 LAB — VITAMIN D 1,25 DIHYDROXY: Vitamin D2 1, 25 (OH)2: <8 pg/mL

## 2011-07-27 NOTE — Telephone Encounter (Signed)
Labs mailed

## 2011-07-27 NOTE — Telephone Encounter (Signed)
Message copied by Beverely Low on Wed Jul 27, 2011  2:02 PM ------      Message from: Sheliah Hatch      Created: Wed Jul 27, 2011  1:00 PM       Normal level

## 2011-07-29 ENCOUNTER — Telehealth: Payer: Self-pay

## 2011-07-29 NOTE — Telephone Encounter (Signed)
Called to give pt lab results. Left a message for pt to return call.  

## 2011-07-31 NOTE — Assessment & Plan Note (Signed)
Pt's PE WNL.  Stressed importance of healthy diet and regular exercise.  Check labs.  Anticipatory guidance provided.

## 2011-07-31 NOTE — Assessment & Plan Note (Signed)
Pap collected. 

## 2011-07-31 NOTE — Assessment & Plan Note (Signed)
Pt sleep walking, talking, and other various activities.  This is becoming more concerning for pt and those around her.  Refer to sleep specialist for complete eval and tx.

## 2011-07-31 NOTE — Assessment & Plan Note (Signed)
Refer for baseline colonoscopy

## 2011-07-31 NOTE — Assessment & Plan Note (Signed)
Check labs.  Adjust meds prn  

## 2011-08-01 ENCOUNTER — Telehealth: Payer: Self-pay

## 2011-08-01 NOTE — Telephone Encounter (Signed)
Spoke with pt about labs. Labs mailed

## 2011-08-01 NOTE — Telephone Encounter (Signed)
Labs mailed

## 2011-08-07 ENCOUNTER — Other Ambulatory Visit: Payer: Self-pay | Admitting: Family Medicine

## 2011-08-16 ENCOUNTER — Encounter: Payer: Self-pay | Admitting: Pulmonary Disease

## 2011-08-16 ENCOUNTER — Ambulatory Visit (INDEPENDENT_AMBULATORY_CARE_PROVIDER_SITE_OTHER): Payer: Managed Care, Other (non HMO) | Admitting: Pulmonary Disease

## 2011-08-16 VITALS — BP 110/70 | HR 87 | Temp 98.6°F | Ht 65.0 in | Wt 208.6 lb

## 2011-08-16 DIAGNOSIS — G478 Other sleep disorders: Secondary | ICD-10-CM

## 2011-08-16 NOTE — Assessment & Plan Note (Signed)
She has sleep disruption, and daytime sleepiness.  She has symptoms suggestive of parasomnia.  I am more concerned that she could have sleep apnea contributing to her symptoms.  To further assess will arrange for sleep study.

## 2011-08-16 NOTE — Patient Instructions (Signed)
Will schedule sleep study Will call to schedule follow up after sleep study reviewed 

## 2011-08-16 NOTE — Progress Notes (Signed)
Subjective:    Patient ID: Joanne Li, female    DOB: 08/29/1961, 50 y.o.   MRN: [id number]  HPI  50 yo female for sleep evaluation.  She has noticed a program for some time.  She has been told that she talks in her sleep, and is very restless.  None of her siblings want to sleep with her when they travel.  She has recently been waking up with a panic and feeling like she can't breath.  She is not sure if she snores.  This seems to be getting worse.  She goes to bed at 9 pm.  She has tried taking alcohol to help sleep, but this didn't help.  She sometimes uses alprazolam.  This helps her to fall asleep, but not stay asleep.  She has lots of things on her mind.  She wakes up 4 or 5 times per night.  She gets out of bed at 5 am.  She drinks coffee in the morning.  She will take a nap in the afternoon before dinner.  She occasionally gets funny feeling in her legs.  The patient denies bruxism.  The patient denies sleep hallucinations, sleep paralysis, or cataplexy.  She denies sinus trouble.  There is no history of thyroid disease.  She has gained about 10 lbs recently.  She does not smoke cigarettes.  Past Medical History  Diagnosis Date  . MVP (mitral valve prolapse)   . Insomnia   . Sleep walking   . Hyperlipidemia      Family History  Problem Relation Age of Onset  . Arthritis Mother   . Arthritis Father   . Coronary artery disease Father     s/p quadruple bypass     History   Social History  . Marital Status: Married    Number of Children: 1   Occupational History  . food Financial planner    Social History Main Topics  . Smoking status: Never Smoker   . Alcohol Use: Yes     occasionally  . Drug Use: No   Allergies  Allergen Reactions  . Nitrofurantoin     REACTION: sick to stomach     Outpatient Prescriptions Prior to Visit  Medication Sig Dispense Refill  . ALPRAZolam (XANAX) 0.5 MG tablet Take 0.5 mg by mouth 3 (three) times daily as needed.        .  simvastatin (ZOCOR) 20 MG tablet TAKE 1 TABLET AT BEDTIME  90 tablet  3   Review of Systems  HENT: Positive for dental problem.   Cardiovascular: Positive for chest pain.  Psychiatric/Behavioral: Positive for dysphoric mood.   Acid Heartburn     Objective:   Physical Exam  BP 110/70  Pulse 87  Temp(Src) 98.6 F (37 C) (Oral)  Ht 5\' 5"  (1.651 m)  Wt 208 lb 9.6 oz (94.62 kg)  BMI 34.71 kg/m2  SpO2 100%  General - Obese, healthy HEENT - PERRLA, EOMI, no sinus tenderness, MP 2, scalloped tongue, decreased AP diameter, no LAN, no thyromegaly Chest - CTA Cardiac - s1s2 no murmur, pulses symmetric Abd - soft, nontender, no organomegaly Ext - no e/c/c Neuro - normal strength, CN intact Psych - normal mood/behavior      Assessment & Plan:   Sleep arousal disorder She has sleep disruption, and daytime sleepiness.  She has symptoms suggestive of parasomnia.  I am more concerned that she could have sleep apnea contributing to her symptoms.  To further assess will arrange for sleep study.  Updated Medication List Outpatient Encounter Prescriptions as of 08/16/2011  Medication Sig Dispense Refill  . ALPRAZolam (XANAX) 0.5 MG tablet Take 0.5 mg by mouth 3 (three) times daily as needed.        . simvastatin (ZOCOR) 20 MG tablet TAKE 1 TABLET AT BEDTIME  90 tablet  3

## 2011-08-29 ENCOUNTER — Other Ambulatory Visit: Payer: Self-pay | Admitting: Family Medicine

## 2011-08-29 ENCOUNTER — Other Ambulatory Visit: Payer: Managed Care, Other (non HMO) | Admitting: Gastroenterology

## 2011-08-29 MED ORDER — ALPRAZOLAM 0.5 MG PO TABS: 0.5000 mg | ORAL_TABLET | Freq: Three times a day (TID) | ORAL | Status: DC | PRN

## 2011-08-29 NOTE — Telephone Encounter (Signed)
Last OV 07/25/11. No last refill date noted in epic or centricity

## 2011-08-29 NOTE — Telephone Encounter (Signed)
Ok for #90, no refills 

## 2011-09-16 ENCOUNTER — Encounter (HOSPITAL_BASED_OUTPATIENT_CLINIC_OR_DEPARTMENT_OTHER): Payer: Managed Care, Other (non HMO)

## 2011-12-13 HISTORY — PX: COLONOSCOPY: SHX174

## 2011-12-30 LAB — HM COLONOSCOPY

## 2012-01-02 ENCOUNTER — Ambulatory Visit: Payer: Managed Care, Other (non HMO) | Admitting: Internal Medicine

## 2012-01-02 ENCOUNTER — Emergency Department (INDEPENDENT_AMBULATORY_CARE_PROVIDER_SITE_OTHER)
Admission: EM | Admit: 2012-01-02 | Discharge: 2012-01-02 | Disposition: A | Discharge status: Home / Self Care | Payer: Managed Care, Other (non HMO) | Source: Home / Self Care | Attending: Family Medicine | Admitting: Family Medicine

## 2012-01-02 ENCOUNTER — Encounter (HOSPITAL_COMMUNITY): Payer: Self-pay

## 2012-01-02 DIAGNOSIS — L509 Urticaria, unspecified: Secondary | ICD-10-CM

## 2012-01-02 MED ORDER — PREDNISONE 20 MG PO TABS: 40.0000 mg | ORAL_TABLET | Freq: Every day | ORAL | Status: AC

## 2012-01-02 NOTE — ED Notes (Signed)
Has URI type syx and hives that come and go; NAD at present; no hives at present

## 2012-01-02 NOTE — ED Provider Notes (Signed)
History     CSN: [id number]  Arrival date & time 01/02/12  1010   First MD Initiated Contact with Patient 01/02/12 1106      Chief Complaint  Patient presents with  . Urticaria    (Consider location/radiation/quality/duration/timing/severity/associated sxs/prior treatment) HPI Comments: Joanne Li presents for evaluation of intermittent hives over her body. They are not present at the moment. She reports increasing frequency of occurrences and worsening of symptoms; she reports lips swelling last night. She denies any new exposures other than their new cat, which they have had for 6 months. She also reports itching in her ears.   Patient is a 51 y.o. female presenting with urticaria. The history is provided by the patient.  Urticaria This is a new problem. The current episode started more than 1 week ago. The problem has not changed since onset.The symptoms are aggravated by nothing. The symptoms are relieved by nothing.    Past Medical History  Diagnosis Date  . MVP (mitral valve prolapse)   . Insomnia   . Sleep walking   . Hyperlipidemia     Past Surgical History  Procedure Date  . Shoulder surgery 2009    right    Family History  Problem Relation Age of Onset  . Arthritis Mother   . Arthritis Father   . Coronary artery disease Father     s/p quadruple bypass    History  Substance Use Topics  . Smoking status: Never Smoker   . Smokeless tobacco: Not on file  . Alcohol Use: Yes     occasionally    OB History    Grav Para Term Preterm Abortions TAB SAB Ect Mult Living   1 1              Review of Systems  Constitutional: Negative.   HENT: Negative.   Eyes: Negative.   Respiratory: Negative.   Cardiovascular: Negative.   Gastrointestinal: Negative.   Genitourinary: Negative.   Musculoskeletal: Negative.   Skin:       Itching and hives  Neurological: Negative.     Allergies  Nitrofurantoin  Home Medications   Current Outpatient Rx  Name Route  Sig Dispense Refill  . ALPRAZOLAM 0.5 MG PO TABS Oral Take 1 tablet (0.5 mg total) by mouth 3 (three) times daily as needed. 90 tablet 0  . PREDNISONE 20 MG PO TABS Oral Take 2 tablets (40 mg total) by mouth daily. 10 tablet 0  . SIMVASTATIN 20 MG PO TABS  TAKE 1 TABLET AT BEDTIME 90 tablet 3    BP 123/74  Pulse 87  Temp(Src) 99.1 F (37.3 C) (Oral)  Resp 18  SpO2 100%  Physical Exam  Nursing note and vitals reviewed. Constitutional: She is oriented to person, place, and time. She appears well-developed and well-nourished.  HENT:  Head: Normocephalic and atraumatic.  Right Ear: Tympanic membrane, external ear and ear canal normal.  Left Ear: Tympanic membrane, external ear and ear canal normal.  Eyes: EOM are normal.  Neck: Normal range of motion.  Pulmonary/Chest: Effort normal and breath sounds normal.  Musculoskeletal: Normal range of motion.  Neurological: She is alert and oriented to person, place, and time.  Skin: Skin is warm, dry and intact. No rash noted.  Psychiatric: Her behavior is normal.    ED Course  Procedures (including critical care time)  Labs Reviewed - No data to display No results found.   1. Urticaria       MDM  Advised over  the counter antihistamine and PCP follow up with possible referral to allergist; rx for prednisone given but advised to hold on to it for now        Richardo Priest, MD 01/02/12 1227

## 2012-02-01 ENCOUNTER — Other Ambulatory Visit: Payer: Self-pay | Admitting: Family Medicine

## 2012-02-01 DIAGNOSIS — Z1231 Encounter for screening mammogram for malignant neoplasm of breast: Secondary | ICD-10-CM

## 2012-02-06 ENCOUNTER — Ambulatory Visit (INDEPENDENT_AMBULATORY_CARE_PROVIDER_SITE_OTHER): Payer: Managed Care, Other (non HMO) | Admitting: Family Medicine

## 2012-02-06 ENCOUNTER — Encounter: Payer: Self-pay | Admitting: Family Medicine

## 2012-02-06 VITALS — BP 118/82 | HR 95 | Temp 98.5°F | Ht 65.5 in | Wt 208.4 lb

## 2012-02-06 DIAGNOSIS — L509 Urticaria, unspecified: Secondary | ICD-10-CM | POA: Insufficient documentation

## 2012-02-06 NOTE — Patient Instructions (Signed)
We'll call you with your allergy referral Start the Zyrtec daily Call with any questions or concerns Hang in there!!!

## 2012-02-06 NOTE — Progress Notes (Signed)
  Subjective:    Patient ID: Joanne Li, female    DOB: Jun 08, 1961, 51 y.o.   MRN: [id number]  HPI Hives- intermittent x3 months.  Will occur 1-2x/week.  Yesterday both lips were swollen.  Admits to 'itchy sensation' but not overwhelming.  Took allergy pill w/ some relief.  sxs started w/ hairline 6 months ago.  Hives will typically appear where there is 'skin to skin contact'.  Denies increased stress.  sxs are short lived.  Denies change in soaps, detergents.  sxs only occuring between 6pm- 2am.  Also had sxs when she slept in hotel.  ? Sweat rash/rxn.   Review of Systems For ROS see HPI     Objective:   Physical Exam  Vitals reviewed. Constitutional: She appears well-developed and well-nourished. No distress.  Skin: Skin is warm and dry. No rash noted. No erythema.       No urticaria, edema, erythema seen today          Assessment & Plan:

## 2012-02-12 NOTE — Assessment & Plan Note (Signed)
New.  Based on pt report.  No current evidence.  Pt reports temporal relation to sxs- always occuring at night.  sxs are short lived and only occur in places where skin touches skin.  Start zyrtec daily.  Refer to allergist.  Reviewed supportive care and red flags that should prompt return.  Pt expressed understanding and is in agreement w/ plan.

## 2012-03-09 ENCOUNTER — Ambulatory Visit (HOSPITAL_COMMUNITY): Payer: Managed Care, Other (non HMO)

## 2012-03-15 ENCOUNTER — Ambulatory Visit (INDEPENDENT_AMBULATORY_CARE_PROVIDER_SITE_OTHER): Payer: Managed Care, Other (non HMO) | Admitting: Family Medicine

## 2012-03-15 ENCOUNTER — Encounter: Payer: Self-pay | Admitting: Family Medicine

## 2012-03-15 VITALS — BP 125/79 | HR 87 | Temp 98.2°F | Ht 65.75 in | Wt 204.6 lb

## 2012-03-15 DIAGNOSIS — B373 Candidiasis of vulva and vagina: Secondary | ICD-10-CM

## 2012-03-15 DIAGNOSIS — E785 Hyperlipidemia, unspecified: Secondary | ICD-10-CM

## 2012-03-15 LAB — HEPATIC FUNCTION PANEL
ALT: 17 U/L (ref 0–35)
AST: 20 U/L (ref 0–37)
Albumin: 3.8 g/dL (ref 3.5–5.2)
Total Protein: 7.1 g/dL (ref 6.0–8.3)

## 2012-03-15 LAB — LIPID PANEL
Cholesterol: 165 mg/dL (ref 0–200)
VLDL: 9.2 mg/dL (ref 0.0–40.0)

## 2012-03-15 MED ORDER — FLUCONAZOLE 150 MG PO TABS: 150.0000 mg | ORAL_TABLET | Freq: Once | ORAL | Status: AC

## 2012-03-15 NOTE — Assessment & Plan Note (Signed)
Recurrent.  Pt's d/c consistent w/ yeast.  Wet prep collected to confirm.  Start Diflucan.

## 2012-03-15 NOTE — Progress Notes (Signed)
  Subjective:    Patient ID: Joanne Li, female    DOB: 1961/01/14, 51 y.o.   MRN: [id number]  HPI Vaginal discharge- denies itching, discharge is thin, no odor.  sxs occur intermittently.  Hyperlipidemia- chronic problem for pt, taking Simvastatin nightly.  No abd pain, N/V, myalgias.   Review of Systems For ROS see HPI     Objective:   Physical Exam  Vitals reviewed. Constitutional: She is oriented to person, place, and time. She appears well-developed and well-nourished. No distress.  HENT:  Head: Normocephalic and atraumatic.  Eyes: Conjunctivae and EOM are normal. Pupils are equal, round, and reactive to light.  Neck: Normal range of motion. Neck supple. No thyromegaly present.  Cardiovascular: Normal rate, regular rhythm, normal heart sounds and intact distal pulses.   No murmur heard. Pulmonary/Chest: Effort normal and breath sounds normal. No respiratory distress.  Abdominal: Soft. She exhibits no distension. There is no tenderness.  Genitourinary: Vaginal discharge (thick, white, clumping) found.  Musculoskeletal: She exhibits no edema.  Lymphadenopathy:    She has no cervical adenopathy.  Neurological: She is alert and oriented to person, place, and time.  Skin: Skin is warm and dry.  Psychiatric: She has a normal mood and affect. Her behavior is normal.          Assessment & Plan:

## 2012-03-15 NOTE — Assessment & Plan Note (Signed)
Chronic.  Due for labs.  Adjust meds prn.

## 2012-03-15 NOTE — Patient Instructions (Signed)
Schedule your complete physical after 8/12 We'll notify you of your results Take the Diflucan for the yeast Call with any questions or concerns [place]!

## 2012-03-16 LAB — WET PREP BY MOLECULAR PROBE
Gardnerella vaginalis: NEGATIVE
Trichomonas vaginosis: NEGATIVE

## 2012-03-30 ENCOUNTER — Ambulatory Visit (HOSPITAL_COMMUNITY)
Admission: RE | Admit: 2012-03-30 | Discharge: 2012-03-30 | Disposition: A | Discharge status: Home / Self Care | Payer: Managed Care, Other (non HMO) | Source: Ambulatory Visit | Attending: Family Medicine | Admitting: Family Medicine

## 2012-03-30 DIAGNOSIS — Z1231 Encounter for screening mammogram for malignant neoplasm of breast: Secondary | ICD-10-CM

## 2012-04-11 ENCOUNTER — Telehealth: Payer: Self-pay | Admitting: Family Medicine

## 2012-04-11 NOTE — Telephone Encounter (Signed)
Pt calling to get results from recent mammogram. Call mobile # [phone]

## 2012-04-11 NOTE — Telephone Encounter (Signed)
Please advise have you seen these results.

## 2012-04-11 NOTE — Telephone Encounter (Signed)
Discuss with patient  

## 2012-04-11 NOTE — Telephone Encounter (Signed)
Report says preliminary- in process.  No results available

## 2012-05-11 ENCOUNTER — Telehealth: Payer: Self-pay | Admitting: Family Medicine

## 2012-05-11 NOTE — Telephone Encounter (Signed)
Called pt to advise that we do not have access to the labs at this time, pt noted the office was to fax them in, went through recent incoming fax, still not in office, gave pt our fax number and she will call the office on Monday to confirm they have sent the labs

## 2012-05-11 NOTE — Telephone Encounter (Signed)
Pt called to tell Dr. Beverely Low that she had labs done at her allergist's office and her iron level was 10. She would like to know if Dr. Beverely Low has any suggestions on how to correct this. She also would like to know how her cholesterol level has changed since she last had labs done. Call mobile # [phone]

## 2012-05-14 NOTE — Telephone Encounter (Signed)
Patient called back wanting to see if you had received. They are not up here so unless you have them I will assume they still have not came in.  Patient ph# [phone]

## 2012-05-16 NOTE — Telephone Encounter (Signed)
Pt calling to see if labs have been received per Selena Batten no labs as of yet. Informed the Pt.

## 2012-05-24 MED ORDER — FERROUS SULFATE 325 (65 FE) MG PO TABS: 325.0000 mg | ORAL_TABLET | Freq: Every day | ORAL | Status: DC

## 2012-05-24 NOTE — Telephone Encounter (Addendum)
Called to advise pt that we received her labs and MD Tabori advised for her to take FeSo4 325mg  daily, this is a RX strength iron supplement, pt was not at home, left message with family member to call office, will also need to clarify pharmacy with pt, pt called office back before closing note and understood all directions, mailed labs to pt clarified address, sent new RX to pharmacy noted in chart.

## 2012-06-22 ENCOUNTER — Telehealth: Payer: Self-pay | Admitting: *Deleted

## 2012-06-22 DIAGNOSIS — L509 Urticaria, unspecified: Secondary | ICD-10-CM

## 2012-06-22 NOTE — Telephone Encounter (Signed)
Pt left vm stating she wants to have a referral to a dermatologist per repeat hives that she can not get rid of, noted recent OV 03-15-12 concerning issue, placed referral in chart, pt aware someone will call her about upcoming apt

## 2012-06-26 NOTE — Telephone Encounter (Signed)
Noted pt was made aware of upcoming dermatology apt on 07-13-12.

## 2012-07-27 ENCOUNTER — Other Ambulatory Visit: Payer: Self-pay | Admitting: Family Medicine

## 2012-08-21 ENCOUNTER — Other Ambulatory Visit (HOSPITAL_COMMUNITY)
Admission: RE | Admit: 2012-08-21 | Discharge: 2012-08-21 | Disposition: A | Discharge status: Home / Self Care | Payer: Managed Care, Other (non HMO) | Source: Ambulatory Visit | Attending: Family Medicine | Admitting: Family Medicine

## 2012-08-21 ENCOUNTER — Ambulatory Visit (INDEPENDENT_AMBULATORY_CARE_PROVIDER_SITE_OTHER): Payer: Managed Care, Other (non HMO) | Admitting: Family Medicine

## 2012-08-21 ENCOUNTER — Encounter: Payer: Self-pay | Admitting: Family Medicine

## 2012-08-21 VITALS — BP 120/80 | HR 77 | Temp 98.3°F | Ht 66.0 in | Wt 206.4 lb

## 2012-08-21 DIAGNOSIS — Z299 Encounter for prophylactic measures, unspecified: Secondary | ICD-10-CM

## 2012-08-21 DIAGNOSIS — Z01419 Encounter for gynecological examination (general) (routine) without abnormal findings: Secondary | ICD-10-CM | POA: Insufficient documentation

## 2012-08-21 DIAGNOSIS — Z1211 Encounter for screening for malignant neoplasm of colon: Secondary | ICD-10-CM

## 2012-08-21 DIAGNOSIS — E785 Hyperlipidemia, unspecified: Secondary | ICD-10-CM

## 2012-08-21 DIAGNOSIS — Z124 Encounter for screening for malignant neoplasm of cervix: Secondary | ICD-10-CM

## 2012-08-21 LAB — BASIC METABOLIC PANEL
BUN: 10 mg/dL (ref 6–23)
CO2: 23 mEq/L (ref 19–32)
Chloride: 107 mEq/L (ref 96–112)
Creatinine, Ser: 0.7 mg/dL (ref 0.4–1.2)
Glucose, Bld: 80 mg/dL (ref 70–99)

## 2012-08-21 LAB — CBC WITH DIFFERENTIAL/PLATELET
Basophils Relative: 0.7 % (ref 0.0–3.0)
Eosinophils Relative: 3.4 % (ref 0.0–5.0)
Hemoglobin: 12.6 g/dL (ref 12.0–15.0)
Lymphocytes Relative: 27.5 % (ref 12.0–46.0)
MCHC: 31.4 g/dL (ref 30.0–36.0)
Monocytes Relative: 8.2 % (ref 3.0–12.0)
Neutro Abs: 2.6 10*3/uL (ref 1.4–7.7)
RBC: 4.63 Mil/uL (ref 3.87–5.11)
WBC: 4.3 10*3/uL — ABNORMAL LOW (ref 4.5–10.5)

## 2012-08-21 LAB — HEPATIC FUNCTION PANEL
ALT: 17 U/L (ref 0–35)
AST: 19 U/L (ref 0–37)
Total Protein: 7.4 g/dL (ref 6.0–8.3)

## 2012-08-21 LAB — TSH: TSH: 1.23 u[IU]/mL (ref 0.35–5.50)

## 2012-08-21 LAB — LIPID PANEL
Cholesterol: 181 mg/dL (ref 0–200)
HDL: 59.3 mg/dL
LDL Cholesterol: 100 mg/dL — ABNORMAL HIGH (ref 0–99)
Total CHOL/HDL Ratio: 3
Triglycerides: 107 mg/dL (ref 0.0–149.0)
VLDL: 21.4 mg/dL (ref 0.0–40.0)

## 2012-08-21 MED ORDER — TETANUS-DIPHTH-ACELL PERTUSSIS 5-2.5-18.5 LF-MCG/0.5 IM SUSP
0.5000 mL | Freq: Once | INTRAMUSCULAR | Status: AC
  Administered 2012-08-21: 0.5 mL via INTRAMUSCULAR

## 2012-08-21 NOTE — Assessment & Plan Note (Signed)
Pap collected. 

## 2012-08-21 NOTE — Progress Notes (Signed)
  Subjective:    Patient ID: Joanne Li, female    DOB: 1961-08-23, 51 y.o.   MRN: [id number]  HPI CPE- UTD on mammo, pap today, overdue for colonoscopy (open to idea).  No concerns today.   Review of Systems Patient reports no vision/ hearing changes, adenopathy,fever, weight change,  persistant/recurrent hoarseness , swallowing issues, chest pain, palpitations, edema, persistant/recurrent cough, hemoptysis, dyspnea (rest/exertional/paroxysmal nocturnal), gastrointestinal bleeding (melena, rectal bleeding), abdominal pain, significant heartburn, bowel changes, GU symptoms (dysuria, hematuria, incontinence), Gyn symptoms (abnormal  bleeding, pain),  syncope, focal weakness, memory loss, numbness & tingling, skin/hair/nail changes, abnormal bruising or bleeding, anxiety, or depression.     Objective:   Physical Exam  General Appearance:    Alert, cooperative, no distress, appears stated age  Head:    Normocephalic, without obvious abnormality, atraumatic  Eyes:    PERRL, conjunctiva/corneas clear, EOM's intact, fundi    benign, both eyes  Ears:    Normal TM's and external ear canals, both ears  Nose:   Nares normal, septum midline, mucosa normal, no drainage    or sinus tenderness  Throat:   Lips, mucosa, and tongue normal; teeth and gums normal  Neck:   Supple, symmetrical, trachea midline, no adenopathy;    Thyroid: no enlargement/tenderness/nodules  Back:     Symmetric, no curvature, ROM normal, no CVA tenderness  Lungs:     Clear to auscultation bilaterally, respirations unlabored  Chest Wall:    No tenderness or deformity   Heart:    Regular rate and rhythm, S1 and S2 normal, no murmur, rub   or gallop  Breast Exam:    No tenderness, masses, or nipple abnormality  Abdomen:     Soft, non-tender, bowel sounds active all four quadrants,    no masses, no organomegaly  Genitalia:    External genitalia normal, cervix normal in appearance, no CMT, uterus in normal size and position,  adnexa w/out mass or tenderness, mucosa pink and moist, no lesions or discharge present  Rectal:    Normal external appearance  Extremities:   Extremities normal, atraumatic, no cyanosis or edema  Pulses:   2+ and symmetric all extremities  Skin:   Skin color, texture, turgor normal, no rashes or lesions  Lymph nodes:   Cervical, supraclavicular, and axillary nodes normal  Neurologic:   CNII-XII intact, normal strength, sensation and reflexes    throughout          Assessment & Plan:

## 2012-08-21 NOTE — Assessment & Plan Note (Signed)
Chronic problem.  Check labs.  Adjust meds prn  

## 2012-08-21 NOTE — Patient Instructions (Addendum)
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

## 2012-08-21 NOTE — Assessment & Plan Note (Signed)
Refer for screening colonoscopy 

## 2012-08-21 NOTE — Assessment & Plan Note (Signed)
Pt's PE WNL.  Refer for colonoscopy.  UTD on mammo.  Pap today.  Check labs.  Anticipatory guidance provided.

## 2012-08-22 ENCOUNTER — Encounter: Payer: Self-pay | Admitting: *Deleted

## 2012-08-23 ENCOUNTER — Encounter: Payer: Self-pay | Admitting: *Deleted

## 2012-08-27 ENCOUNTER — Encounter: Payer: Self-pay | Admitting: Gastroenterology

## 2012-08-28 LAB — VITAMIN D 1,25 DIHYDROXY
Vitamin D 1, 25 (OH)2 Total: 62 pg/mL (ref 18–72)
Vitamin D2 1, 25 (OH)2: <8 pg/mL
Vitamin D3 1, 25 (OH)2: 62 pg/mL

## 2012-08-29 ENCOUNTER — Encounter: Payer: Self-pay | Admitting: *Deleted

## 2012-09-28 ENCOUNTER — Telehealth: Payer: Self-pay | Admitting: Family Medicine

## 2012-09-28 NOTE — Telephone Encounter (Signed)
Last OV 08-21-12 last refill 08-29-11 #90 no refills, please note pt called incoming at 4:57pm, noted on desktop after this nurse meeting at 5:30pm, no MD in office at time, please advise, tried to call pt to advise the refill may have to wait til Monday and no vm capability available

## 2012-09-28 NOTE — Telephone Encounter (Signed)
Refill: Alprazolam 0.5 mg tablet. Take 1 tablet three times a day. Qty 90. Last fill 08-29-11

## 2012-09-30 NOTE — Telephone Encounter (Signed)
Ok for #90, no refill 

## 2012-10-01 MED ORDER — ALPRAZOLAM 0.5 MG PO TABS: 0.5000 mg | ORAL_TABLET | Freq: Three times a day (TID) | ORAL | Status: DC | PRN

## 2012-10-01 NOTE — Telephone Encounter (Signed)
.  rx faxed to pharmacy, manually.  

## 2012-10-02 ENCOUNTER — Other Ambulatory Visit: Payer: Self-pay | Admitting: Family Medicine

## 2012-10-02 MED ORDER — SIMVASTATIN 20 MG PO TABS: 20.0000 mg | ORAL_TABLET | Freq: Every day | ORAL | Status: DC

## 2012-10-02 NOTE — Telephone Encounter (Signed)
rx sent to pharmacy by e-script  

## 2012-10-02 NOTE — Telephone Encounter (Signed)
refill Simvastatin Tabs 20mg  #90 Take one tablet at bed time--last wrt 8.16.13 #90--last OV 9.10.13 Annual

## 2012-10-09 NOTE — Telephone Encounter (Signed)
Called pharmacy and spoke to rep justin to clarify the xanax refill has been received, noted the rx was not received, Jill Alexanders took a verbal order after clarifying the correct fax number as [phone], verbal of xanax 0.5mg  take one tab TID/PRN #90 no refills, justin will fill for the pt and notify pt when ready

## 2012-10-09 NOTE — Telephone Encounter (Signed)
2nd refill request for alprazolam 0.5 mg tablet. Please verify pharmacy received rx faxed 10/01/12.

## 2012-10-12 ENCOUNTER — Encounter: Payer: Managed Care, Other (non HMO) | Admitting: Gastroenterology

## 2012-11-23 ENCOUNTER — Ambulatory Visit (AMBULATORY_SURGERY_CENTER): Payer: Managed Care, Other (non HMO)

## 2012-11-23 VITALS — Ht 65.0 in | Wt 209.0 lb

## 2012-11-23 DIAGNOSIS — Z1211 Encounter for screening for malignant neoplasm of colon: Secondary | ICD-10-CM

## 2012-11-23 MED ORDER — MOVIPREP 100 G PO SOLR: 1.0000 | Freq: Once | ORAL | Status: DC

## 2012-11-29 ENCOUNTER — Telehealth: Payer: Self-pay | Admitting: Internal Medicine

## 2012-11-29 NOTE — Telephone Encounter (Signed)
Pt 's diet restrictions were reviewed with pt.

## 2012-12-03 ENCOUNTER — Encounter: Payer: Self-pay | Admitting: Internal Medicine

## 2012-12-03 ENCOUNTER — Ambulatory Visit (AMBULATORY_SURGERY_CENTER): Payer: Managed Care, Other (non HMO) | Admitting: Internal Medicine

## 2012-12-03 VITALS — BP 110/70 | HR 73 | Temp 99.0°F | Resp 15 | Ht 65.0 in | Wt 209.0 lb

## 2012-12-03 DIAGNOSIS — Z1211 Encounter for screening for malignant neoplasm of colon: Secondary | ICD-10-CM

## 2012-12-03 DIAGNOSIS — D126 Benign neoplasm of colon, unspecified: Secondary | ICD-10-CM

## 2012-12-03 DIAGNOSIS — K635 Polyp of colon: Secondary | ICD-10-CM

## 2012-12-03 MED ORDER — SODIUM CHLORIDE 0.9 % IV SOLN: 500.0000 mL | INTRAVENOUS | Status: DC

## 2012-12-03 NOTE — Patient Instructions (Signed)
YOU HAD AN ENDOSCOPIC PROCEDURE TODAY AT THE [facility]: Refer to the procedure report that was given to you for any specific questions about what was found during the examination.  If the procedure report does not answer your questions, please call your gastroenterologist to clarify.  If you requested that your care partner not be given the details of your procedure findings, then the procedure report has been included in a sealed envelope for you to review at your convenience later.  YOU SHOULD EXPECT: Some feelings of bloating in the abdomen. Passage of more gas than usual.  Walking can help get rid of the air that was put into your GI tract during the procedure and reduce the bloating. If you had a lower endoscopy (such as a colonoscopy or flexible sigmoidoscopy) you may notice spotting of blood in your stool or on the toilet paper. If you underwent a bowel prep for your procedure, then you may not have a normal bowel movement for a few days.  DIET: Your first meal following the procedure should be a light meal and then it is ok to progress to your normal diet.  A half-sandwich or bowl of soup is an example of a good first meal.  Heavy or fried foods are harder to digest and may make you feel nauseous or bloated.  Likewise meals heavy in dairy and vegetables can cause extra gas to form and this can also increase the bloating.  Drink plenty of fluids but you should avoid alcoholic beverages for 24 hours.  ACTIVITY: Your care partner should take you home directly after the procedure.  You should plan to take it easy, moving slowly for the rest of the day.  You can resume normal activity the day after the procedure however you should NOT DRIVE or use heavy machinery for 24 hours (because of the sedation medicines used during the test).    SYMPTOMS TO REPORT IMMEDIATELY: A gastroenterologist can be reached at any hour.  During normal business hours, 8:30 AM to 5:00 PM Monday through Friday,  call [phone].  After hours and on weekends, please call the GI answering service at [phone] who will take a message and have the physician on call contact you.   Following lower endoscopy (colonoscopy or flexible sigmoidoscopy):  Excessive amounts of blood in the stool  Significant tenderness or worsening of abdominal pains  Swelling of the abdomen that is new, acute  Fever of 100F or higher    FOLLOW UP: If any biopsies were taken you will be contacted by phone or by letter within the next 1-3 weeks.  Call your gastroenterologist if you have not heard about the biopsies in 3 weeks.  Our staff will call the home number listed on your records the next business day following your procedure to check on you and address any questions or concerns that you may have at that time regarding the information given to you following your procedure. This is a courtesy call and so if there is no answer at the home number and we have not heard from you through the emergency physician on call, we will assume that you have returned to your regular daily activities without incident.  SIGNATURES/CONFIDENTIALITY: You and/or your care partner have signed paperwork which will be entered into your electronic medical record.  These signatures attest to the fact that that the information above on your After Visit Summary has been reviewed and is understood.  Full responsibility of the confidentiality   of this discharge information lies with you and/or your care-partner.   Information on polyps & hemorrhoids given to you today 

## 2012-12-03 NOTE — Progress Notes (Signed)
Called to room to assist during endoscopic procedure.  Patient ID and intended procedure confirmed with present staff. Received instructions for my participation in the procedure from the performing physician.  

## 2012-12-03 NOTE — Progress Notes (Signed)
Patient did not have preoperative order for IV antibiotic SSI prophylaxis. (G8918)  Patient did not experience any of the following events: a burn prior to discharge; a fall within the facility; wrong site/side/patient/procedure/implant event; or a hospital transfer or hospital admission upon discharge from the facility. (G8907)  

## 2012-12-03 NOTE — Op Note (Signed)
[facility] [address] [place], [address]   COLONOSCOPY PROCEDURE REPORT  PATIENT: Jerie, Basford  MR#: [id number] BIRTHDATE: 09/03/1961 , 51  yrs. old GENDER: Female ENDOSCOPIST: Beverley Fiedler, MD REFERRED HQ:IONGEX, Natalia Leatherwood PROCEDURE DATE:  12/03/2012 PROCEDURE:   Colonoscopy with cold biopsy polypectomy ASA CLASS:   Class II INDICATIONS:average risk screening and first colonoscopy. MEDICATIONS: MAC sedation, administered by CRNA and propofol (Diprivan) 400mg  IV  DESCRIPTION OF PROCEDURE:   After the risks benefits and alternatives of the procedure were thoroughly explained, informed consent was obtained.  A digital rectal exam revealed external hemorrhoids and A digital rectal exam revealed no rectal mass. The LB [id number]  endoscope was introduced through the anus and advanced to the terminal ileum which was intubated for a short distance. No adverse events experienced.   The quality of the prep was good, using MoviPrep  The instrument was then slowly withdrawn as the colon was fully examined.  COLON FINDINGS: A sessile polyp measuring 3 mm in size was found at the hepatic flexure.  A polypectomy was performed with cold forceps.  The resection was complete and the polyp tissue was completely retrieved.   The colon mucosa was otherwise normal. Retroflexed views revealed external hemorrhoids. The time to cecum=3 minutes 35 seconds.  Withdrawal time=12 minutes 30 seconds. The scope was withdrawn and the procedure completed.  COMPLICATIONS: There were no complications.  ENDOSCOPIC IMPRESSION: 1.   Sessile polyp measuring 3 mm in size was found at the hepatic flexure; polypectomy was performed with cold forceps 2.   The colon mucosa was otherwise normal 3.   External hemorrhoids  RECOMMENDATIONS: 1.  Await pathology results 2.  If the polyp removed today is proven to be an adenomatous (pre-cancerous) polyp, you will need a repeat colonoscopy  in 5 years.  Otherwise you should continue to follow colorectal cancer screening guidelines for "routine risk" patients with colonoscopy in 10 years.  You will receive a letter within 1-2 weeks with the results of your biopsy as well as final recommendations.  Please call my office if you have not received a letter after 3 weeks.   eSigned:  Beverley Fiedler, MD 12/03/2012 8:41 AM   cc: [name] and The Patient

## 2012-12-04 ENCOUNTER — Telehealth: Payer: Self-pay | Admitting: *Deleted

## 2012-12-04 NOTE — Telephone Encounter (Signed)
  Follow up Call-  Call back number 12/03/2012  Post procedure Call Back phone  # [phone]  Permission to leave phone message Yes     Patient questions:  Do you have a fever, pain , or abdominal swelling? no Pain Score  0 *  Have you tolerated food without any problems? yes  Have you been able to return to your normal activities? yes  Do you have any questions about your discharge instructions: Diet   no Medications  no Follow up visit  no  Do you have questions or concerns about your Care? no  Actions: * If pain score is 4 or above: No action needed, pain <4.

## 2012-12-13 ENCOUNTER — Encounter: Payer: Self-pay | Admitting: Internal Medicine

## 2013-02-25 ENCOUNTER — Other Ambulatory Visit: Payer: Self-pay | Admitting: Family Medicine

## 2013-02-25 DIAGNOSIS — Z1231 Encounter for screening mammogram for malignant neoplasm of breast: Secondary | ICD-10-CM

## 2013-04-02 ENCOUNTER — Ambulatory Visit (HOSPITAL_COMMUNITY)
Admission: RE | Admit: 2013-04-02 | Discharge: 2013-04-02 | Disposition: A | Discharge status: Home / Self Care | Payer: Managed Care, Other (non HMO) | Source: Ambulatory Visit | Attending: Family Medicine | Admitting: Family Medicine

## 2013-04-02 DIAGNOSIS — Z1231 Encounter for screening mammogram for malignant neoplasm of breast: Secondary | ICD-10-CM | POA: Insufficient documentation

## 2013-06-12 ENCOUNTER — Other Ambulatory Visit: Payer: Self-pay | Admitting: Family Medicine

## 2013-06-13 ENCOUNTER — Telehealth: Payer: Self-pay | Admitting: *Deleted

## 2013-06-13 NOTE — Telephone Encounter (Signed)
Refill request for alprazolam 0.5mg  Last OV 9.10.13 Last filled 10.21.13

## 2013-06-13 NOTE — Telephone Encounter (Signed)
Ok to refill?  Last OV 9.10.13 Last filled 10.21.13 #90 no refills

## 2013-06-13 NOTE — Telephone Encounter (Signed)
Already approved

## 2013-11-28 ENCOUNTER — Telehealth: Payer: Self-pay | Admitting: Family Medicine

## 2013-11-28 NOTE — Telephone Encounter (Signed)
FYI..   Called pt and notified on Voicemail that unfortunately we cannot complete labs without seeing er since it has been over a year since her last appt. If she wold like these labs completed she either needs another CPE appt or at minimum a follow up appt for her cholesterol.

## 2013-11-28 NOTE — Telephone Encounter (Signed)
Pt should have a yearly physical (it's been over a year) and if not a physical, at least needs a visit for cholesterol.  We cannot just do lab orders when pt's have not been seen

## 2013-11-28 NOTE — Telephone Encounter (Signed)
Patient cancelled her physical because she says she "no longer needs it." She wants to come in to have a cholesterol check, lab only. Please advise.

## 2013-11-29 ENCOUNTER — Telehealth: Payer: Self-pay

## 2013-11-29 NOTE — Telephone Encounter (Signed)
Patient stated that she had cancelled appt due to her gyn visit being her CPE minus Lipids panel. Advised that I would investigate and RTC.  Per notes and assistant patient will need to be seen either as CPE or at minimum an OV due to being >1 year. LVM for patient to CB to advise plan.

## 2013-12-02 ENCOUNTER — Encounter: Payer: Managed Care, Other (non HMO) | Admitting: Family Medicine

## 2013-12-02 NOTE — Telephone Encounter (Signed)
Patient rescheduled for 12/04/2013

## 2013-12-03 ENCOUNTER — Encounter: Payer: Self-pay | Admitting: Lab

## 2013-12-04 ENCOUNTER — Encounter: Payer: Self-pay | Admitting: Lab

## 2013-12-04 ENCOUNTER — Ambulatory Visit (INDEPENDENT_AMBULATORY_CARE_PROVIDER_SITE_OTHER): Payer: Managed Care, Other (non HMO) | Admitting: Family Medicine

## 2013-12-04 ENCOUNTER — Encounter: Payer: Self-pay | Admitting: Family Medicine

## 2013-12-04 VITALS — BP 122/72 | HR 79 | Temp 98.2°F | Resp 16 | Wt 210.0 lb

## 2013-12-04 DIAGNOSIS — E785 Hyperlipidemia, unspecified: Secondary | ICD-10-CM

## 2013-12-04 DIAGNOSIS — K219 Gastro-esophageal reflux disease without esophagitis: Secondary | ICD-10-CM

## 2013-12-04 DIAGNOSIS — F411 Generalized anxiety disorder: Secondary | ICD-10-CM

## 2013-12-04 DIAGNOSIS — R002 Palpitations: Secondary | ICD-10-CM

## 2013-12-04 LAB — BASIC METABOLIC PANEL
CO2: 27 mEq/L (ref 19–32)
Chloride: 105 mEq/L (ref 96–112)
Creatinine, Ser: 0.7 mg/dL (ref 0.4–1.2)
Potassium: 3.8 mEq/L (ref 3.5–5.1)

## 2013-12-04 LAB — HEPATIC FUNCTION PANEL
ALT: 12 U/L (ref 0–35)
AST: 16 U/L (ref 0–37)
Bilirubin, Direct: 0 mg/dL (ref 0.0–0.3)
Total Bilirubin: 0.3 mg/dL (ref 0.3–1.2)
Total Protein: 6.9 g/dL (ref 6.0–8.3)

## 2013-12-04 LAB — CBC WITH DIFFERENTIAL/PLATELET
Basophils Absolute: 0 10*3/uL (ref 0.0–0.1)
Basophils Relative: 0.7 % (ref 0.0–3.0)
Eosinophils Absolute: 0.1 10*3/uL (ref 0.0–0.7)
Hemoglobin: 11.4 g/dL — ABNORMAL LOW (ref 12.0–15.0)
Lymphocytes Relative: 31.6 % (ref 12.0–46.0)
Monocytes Relative: 7.5 % (ref 3.0–12.0)
Neutro Abs: 2.1 10*3/uL (ref 1.4–7.7)
Neutrophils Relative %: 57.6 % (ref 43.0–77.0)
RBC: 4.1 Mil/uL (ref 3.87–5.11)
RDW: 15.2 % — ABNORMAL HIGH (ref 11.5–14.6)

## 2013-12-04 LAB — LIPID PANEL
Total CHOL/HDL Ratio: 4
Triglycerides: 113 mg/dL (ref 0.0–149.0)

## 2013-12-04 LAB — TSH: TSH: 1.31 u[IU]/mL (ref 0.35–5.50)

## 2013-12-04 MED ORDER — OMEPRAZOLE 40 MG PO CPDR: 40.0000 mg | DELAYED_RELEASE_CAPSULE | Freq: Every day | ORAL | Status: DC

## 2013-12-04 MED ORDER — ALPRAZOLAM 0.5 MG PO TABS: ORAL_TABLET | ORAL | Status: DC

## 2013-12-04 NOTE — Assessment & Plan Note (Signed)
Unchanged.  Refill xanax.

## 2013-12-04 NOTE — Assessment & Plan Note (Signed)
Chronic problem.  Pt stopped statin 6 months ago w/out alerting office.  Check labs.  Restart meds prn.

## 2013-12-04 NOTE — Assessment & Plan Note (Signed)
New.  No obvious cause on EKG.  Check labs to r/o metabolic cause.  Will follow.

## 2013-12-04 NOTE — Progress Notes (Signed)
Pre visit review using our clinic review tool, if applicable. No additional management support is needed unless otherwise documented below in the visit note. 

## 2013-12-04 NOTE — Progress Notes (Signed)
   Subjective:    Patient ID: [name], female    DOB: [birth year], 52 y.o.   MRN: [id number]  HPI Hyperlipidemia- chronic problem, stopped the simvastatin 6 months ago after failing to get this refilled.  Not exercising regularly.  GERD- chronic problem, taking OTC acid reducer w/out relief.  Anxiety- chronic problem, due for refill on xanax.  Palpitations- typically occurring at rest but has started to become noticeable during the day.  No CP, SOB, N/V, dizziness.  Review of Systems For ROS see HPI     Objective:   Physical Exam  Vitals reviewed. Constitutional: She is oriented to person, place, and time. She appears well-developed and well-nourished. No distress.  HENT:  Head: Normocephalic and atraumatic.  Eyes: Conjunctivae and EOM are normal. Pupils are equal, round, and reactive to light.  Neck: Normal range of motion. Neck supple. No thyromegaly present.  Cardiovascular: Normal rate, regular rhythm, normal heart sounds and intact distal pulses.   No murmur heard. Pulmonary/Chest: Effort normal and breath sounds normal. No respiratory distress.  Abdominal: Soft. She exhibits no distension. There is no tenderness.  Musculoskeletal: She exhibits no edema.  Lymphadenopathy:    She has no cervical adenopathy.  Neurological: She is alert and oriented to person, place, and time.  Skin: Skin is warm and dry.  Psychiatric: She has a normal mood and affect. Her behavior is normal.          Assessment & Plan:

## 2013-12-04 NOTE — Patient Instructions (Addendum)
Follow up in 6 months to recheck cholesterol We'll notify you of your lab results and make any changes if needed Call if you continue to have palpitations Start the Omeprazole for the reflux Use the Xanax as needed for anxiety Call with any questions or concerns Hang in there! Happy Holidays!

## 2013-12-04 NOTE — Assessment & Plan Note (Signed)
Deteriorated.  Start prescription PPI.  Will follow.

## 2013-12-06 ENCOUNTER — Other Ambulatory Visit: Payer: Self-pay | Admitting: General Practice

## 2013-12-06 DIAGNOSIS — E785 Hyperlipidemia, unspecified: Secondary | ICD-10-CM

## 2013-12-06 LAB — LDL CHOLESTEROL, DIRECT: Direct LDL: 154.5 mg/dL

## 2013-12-06 MED ORDER — SIMVASTATIN 20 MG PO TABS: 20.0000 mg | ORAL_TABLET | Freq: Every day | ORAL | Status: DC

## 2013-12-17 ENCOUNTER — Other Ambulatory Visit: Payer: Self-pay | Admitting: Obstetrics and Gynecology

## 2013-12-21 ENCOUNTER — Encounter: Payer: Self-pay | Admitting: Family Medicine

## 2013-12-22 ENCOUNTER — Encounter: Payer: Self-pay | Admitting: Family Medicine

## 2013-12-23 MED ORDER — SIMVASTATIN 20 MG PO TABS: 20.0000 mg | ORAL_TABLET | Freq: Every day | ORAL | Status: DC

## 2013-12-23 NOTE — Telephone Encounter (Signed)
Med filled.  

## 2014-03-10 ENCOUNTER — Other Ambulatory Visit: Payer: Self-pay | Admitting: Family Medicine

## 2014-03-10 DIAGNOSIS — Z1231 Encounter for screening mammogram for malignant neoplasm of breast: Secondary | ICD-10-CM

## 2014-03-19 ENCOUNTER — Telehealth: Payer: Self-pay | Admitting: Family Medicine

## 2014-03-19 MED ORDER — SIMVASTATIN 20 MG PO TABS: 20.0000 mg | ORAL_TABLET | Freq: Every day | ORAL | Status: DC

## 2014-03-19 NOTE — Telephone Encounter (Signed)
Med filled.  

## 2014-03-19 NOTE — Telephone Encounter (Signed)
03/19/14  Pt is needing a refill on rx simvastatin (ZOCOR) 20 MG tablet.

## 2014-03-31 ENCOUNTER — Ambulatory Visit (INDEPENDENT_AMBULATORY_CARE_PROVIDER_SITE_OTHER): Payer: Managed Care, Other (non HMO) | Admitting: Internal Medicine

## 2014-03-31 ENCOUNTER — Encounter: Payer: Self-pay | Admitting: Internal Medicine

## 2014-03-31 VITALS — BP 106/69 | HR 77 | Temp 98.2°F | Wt 201.0 lb

## 2014-03-31 DIAGNOSIS — M25579 Pain in unspecified ankle and joints of unspecified foot: Secondary | ICD-10-CM

## 2014-03-31 NOTE — Assessment & Plan Note (Signed)
Foot pain as described above, suspect neuropathy. In addition to labs I suggested a trial with Neurontin or Lyrica, the patient strongly declined. We also talked about possible nerve conduction study but she also declined  Plan: Labs See PCP in  2 months

## 2014-03-31 NOTE — Progress Notes (Signed)
Pre visit review using our clinic review tool, if applicable. No additional management support is needed unless otherwise documented below in the visit note. 

## 2014-03-31 NOTE — Progress Notes (Signed)
   Subjective:    Patient ID: Joanne Li, female    DOB: 05-06-1961, 53 y.o.   MRN: [id number]  DOS:  03/31/2014 Type of  visit: Acute visit Long history of feet pain, worse in the last 2 months.  Seems like she has 2 different pains: A throbbing pain on the medial aspect of the feet and a  burning sensation at the end of all toes.Both pains are worse at night. Sometimes symptoms are intense.  ROS Denies any back pain. She has a history of plantar fasciitis, this pain is definitely different. No pain in the arch. No swelling. No RLS type of symptoms.   Past Medical History  Diagnosis Date  . MVP (mitral valve prolapse)   . Insomnia   . Sleep walking   . Hyperlipidemia     Past Surgical History  Procedure Laterality Date  . Shoulder surgery  2009    right    History   Social History  . Marital Status: Married    Spouse Name: N/A    Number of Children: 1  . Years of Education: N/A   Occupational History  . food Therapist, nutritional    Social History Main Topics  . Smoking status: Never Smoker   . Smokeless tobacco: Never Used  . Alcohol Use: Yes     Comment: occasionally  . Drug Use: No  . Sexual Activity: Not on file   Other Topics Concern  . Not on file   Social History Narrative  . No narrative on file        Medication List       This list is accurate as of: 03/31/14  5:31 PM.  Always use your most recent med list.               ALPRAZolam 0.5 MG tablet  Commonly known as:  XANAX  TAKE 1 TABLET BY MOUTH 3 TIMES A DAY     ferrous sulfate 325 (65 FE) MG tablet  Take 1 tablet (325 mg total) by mouth daily with breakfast.     Iron Tabs  Take by mouth.     omeprazole 40 MG capsule  Commonly known as:  PRILOSEC  Take 1 capsule (40 mg total) by mouth daily.     simvastatin 20 MG tablet  Commonly known as:  ZOCOR  Take 1 tablet (20 mg total) by mouth daily.           Objective:   Physical Exam BP 106/69  Pulse 77  Temp(Src) 98.2 F  (36.8 C)  Wt 201 lb (91.173 kg)  SpO2 100% General -- alert, well-developed, NAD.  Extremities-- no pretibial edema bilaterally . Normal pedal pulses, feet skin normal. Neurologic--  alert & oriented X3. Speech normal, gait normal, strength normal in all extremities.  DTRs symmetric , pinprick feet exam and proprioception wnl Psych-- Cognition and judgment appear intact. Cooperative with normal attention span and concentration. No anxious or depressed appearing.      Assessment & Plan:

## 2014-03-31 NOTE — Patient Instructions (Signed)
Get your blood work before you leave   Please schedule a visit to see your primary doctor in 2 months

## 2014-04-01 LAB — VITAMIN B12: Vitamin B-12: 559 pg/mL (ref 211–911)

## 2014-04-01 LAB — FERRITIN: FERRITIN: 15 ng/mL (ref 10.0–291.0)

## 2014-04-01 LAB — HEMOGLOBIN A1C: Hgb A1c MFr Bld: 4.9 % (ref 4.6–6.5)

## 2014-04-01 LAB — IRON: IRON: 48 ug/dL (ref 42–145)

## 2014-04-01 LAB — FOLATE

## 2014-04-03 ENCOUNTER — Ambulatory Visit (HOSPITAL_COMMUNITY): Payer: Managed Care, Other (non HMO)

## 2014-04-21 ENCOUNTER — Ambulatory Visit (HOSPITAL_COMMUNITY)
Admission: RE | Admit: 2014-04-21 | Discharge: 2014-04-21 | Disposition: A | Discharge status: Home / Self Care | Payer: Managed Care, Other (non HMO) | Source: Ambulatory Visit | Attending: Family Medicine | Admitting: Family Medicine

## 2014-04-21 DIAGNOSIS — Z1231 Encounter for screening mammogram for malignant neoplasm of breast: Secondary | ICD-10-CM | POA: Insufficient documentation

## 2014-04-21 LAB — HM MAMMOGRAPHY: HM MAMMO: NORMAL

## 2014-05-19 ENCOUNTER — Encounter: Payer: Self-pay | Admitting: Family Medicine

## 2014-05-19 ENCOUNTER — Ambulatory Visit (INDEPENDENT_AMBULATORY_CARE_PROVIDER_SITE_OTHER): Payer: Managed Care, Other (non HMO) | Admitting: Family Medicine

## 2014-05-19 ENCOUNTER — Ambulatory Visit: Payer: Managed Care, Other (non HMO) | Admitting: Family Medicine

## 2014-05-19 VITALS — BP 104/72 | HR 78 | Temp 98.0°F | Resp 16 | Wt 205.5 lb

## 2014-05-19 DIAGNOSIS — G609 Hereditary and idiopathic neuropathy, unspecified: Secondary | ICD-10-CM

## 2014-05-19 DIAGNOSIS — M25579 Pain in unspecified ankle and joints of unspecified foot: Secondary | ICD-10-CM

## 2014-05-19 DIAGNOSIS — G5793 Unspecified mononeuropathy of bilateral lower limbs: Secondary | ICD-10-CM

## 2014-05-19 DIAGNOSIS — E785 Hyperlipidemia, unspecified: Secondary | ICD-10-CM

## 2014-05-19 MED ORDER — MELOXICAM 15 MG PO TABS: 15.0000 mg | ORAL_TABLET | Freq: Every day | ORAL | Status: DC

## 2014-05-19 NOTE — Progress Notes (Signed)
   Subjective:    Patient ID: Joanne Li, female    DOB: 1961/04/30, 53 y.o.   MRN: [id number]  HPI Hyperlipidemia- chronic problem, on Simvastatin.  Pt reports good compliance.  Denies abd pain, N/V, myalgias.  Foot pain- saw Dr Larose Kells 2 months ago and was dx'd w/ neuropathy.  Was offered trial of Neurontin/Lyrica but pt declined.  Pain is bilateral.  Had associated numbness.  Thought it was possibly due to her footwear choice.  Pain in arch improved w/ new shoe choice.  Will have 'burning pins and needle type pain' at night.  Pain was concentrated in ankle and proximal foot.   Review of Systems For ROS see HPI     Objective:   Physical Exam  Vitals reviewed. Constitutional: She is oriented to person, place, and time. She appears well-developed and well-nourished. No distress.  HENT:  Head: Normocephalic and atraumatic.  Eyes: Conjunctivae and EOM are normal. Pupils are equal, round, and reactive to light.  Neck: Normal range of motion. Neck supple. No thyromegaly present.  Cardiovascular: Normal rate, regular rhythm, normal heart sounds and intact distal pulses.   No murmur heard. Pulmonary/Chest: Effort normal and breath sounds normal. No respiratory distress.  Abdominal: Soft. She exhibits no distension. There is no tenderness.  Musculoskeletal: She exhibits tenderness (over dorsum of feet bilaterally). She exhibits no edema.  Lymphadenopathy:    She has no cervical adenopathy.  Neurological: She is alert and oriented to person, place, and time.  Skin: Skin is warm and dry.  Psychiatric: She has a normal mood and affect. Her behavior is normal.          Assessment & Plan:

## 2014-05-19 NOTE — Patient Instructions (Signed)
Cancel your June 15th appt and schedule your physical in 6 months We'll notify you of your lab results and make any changes if needed Start the Meloxicam daily for inflammation- take w/ food We'll call you with your sports med appt Call with any questions or concerns HAPPY BIRTHDAY!!!

## 2014-05-19 NOTE — Assessment & Plan Note (Signed)
Chronic problem.  Pt reports improved compliance w/ medication.  No side effects.  Check labs.  Adjust meds prn

## 2014-05-19 NOTE — Assessment & Plan Note (Signed)
New to provider.  Reviewed labs done at last visit- all WNL.  No obvious cause.  Again discussed starting gabapentin nightly to improve both burning sensation and sleep- pt prefers to see Sports Medicine first and see if those interventions prove helpful.  Will follow.

## 2014-05-19 NOTE — Progress Notes (Signed)
Pre visit review using our clinic review tool, if applicable. No additional management support is needed unless otherwise documented below in the visit note. 

## 2014-05-19 NOTE — Assessment & Plan Note (Signed)
New to provider.  Reviewed notes from last OV.  Pt did get some relief w/ changing shoes.  Based on this, suspect there may be a mechanical issue.  Will refer to Dr Tamala Julian for evaluation and tx.  Pt to start daily NSAID for pain.  Will follow.

## 2014-05-20 LAB — BASIC METABOLIC PANEL
BUN: 9 mg/dL (ref 6–23)
CO2: 24 mEq/L (ref 19–32)
CREATININE: 0.7 mg/dL (ref 0.4–1.2)
Calcium: 9.3 mg/dL (ref 8.4–10.5)
Chloride: 107 mEq/L (ref 96–112)
GFR: 108.97 mL/min (ref >60.00)
Glucose, Bld: 63 mg/dL — ABNORMAL LOW (ref 70–99)
POTASSIUM: 4.2 meq/L (ref 3.5–5.1)
Sodium: 139 mEq/L (ref 135–145)

## 2014-05-20 LAB — LIPID PANEL
Cholesterol: 159 mg/dL (ref 0–200)
HDL: 57.3 mg/dL (ref >39.00)
LDL Cholesterol: 93 mg/dL (ref 0–99)
NonHDL: 101.7
TRIGLYCERIDES: 46 mg/dL (ref 0.0–149.0)
Total CHOL/HDL Ratio: 3
VLDL: 9.2 mg/dL (ref 0.0–40.0)

## 2014-05-20 LAB — HEPATIC FUNCTION PANEL
ALK PHOS: 45 U/L (ref 39–117)
ALT: 14 U/L (ref 0–35)
AST: 20 U/L (ref 0–37)
Albumin: 3.9 g/dL (ref 3.5–5.2)
BILIRUBIN DIRECT: 0 mg/dL (ref 0.0–0.3)
TOTAL PROTEIN: 6.9 g/dL (ref 6.0–8.3)
Total Bilirubin: 0.3 mg/dL (ref 0.2–1.2)

## 2014-05-21 ENCOUNTER — Ambulatory Visit (INDEPENDENT_AMBULATORY_CARE_PROVIDER_SITE_OTHER): Payer: Managed Care, Other (non HMO) | Admitting: Family Medicine

## 2014-05-21 ENCOUNTER — Encounter: Payer: Self-pay | Admitting: Family Medicine

## 2014-05-21 VITALS — BP 130/80 | HR 71 | Ht 65.0 in | Wt 205.0 lb

## 2014-05-21 DIAGNOSIS — G5793 Unspecified mononeuropathy of bilateral lower limbs: Secondary | ICD-10-CM

## 2014-05-21 DIAGNOSIS — G609 Hereditary and idiopathic neuropathy, unspecified: Secondary | ICD-10-CM

## 2014-05-21 NOTE — Progress Notes (Signed)
  [facility] [address] [place], [place] [address] Phone: [phone] Subjective:    I'm seeing this patient by the request  of:  Annye Asa, MD   CC: Bilateral foot pain and numbness  [id number] Joanne Li is a 53 y.o. female coming in with complaint of bilateral foot pain and numbness. Patient states she has had this for a couple months. Patient has seen to providers for this problem previously. Patient was given to the possibility of starting a prescription of gabapentin which patient declined. Patient states that she is attempted to try to change footwear which has made some mild improvement. Patient notices that the pain and numbness is actually worse if she is barefoot. Patient also states that she does have cramping in her toes especially at night. Patient states that this does seem to be better in the mornings. 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 Patient is attempting to lose weight and is enjoying walking a regular basis. Patient denies any discoloration or bruising. He describes the discomfort as more once again as numbness and maybe a little of a burning sensation that the goal aching pain is mostly on the plantar aspects of the feet. Patient rates the severity of 4/10.     Past medical history, social, surgical and family history all reviewed in electronic medical record.   Review of Systems: No headache, visual changes, nausea, vomiting, diarrhea, constipation, dizziness, abdominal pain, skin rash, fevers, chills, night sweats, weight loss, swollen lymph nodes, body aches, joint swelling, muscle aches, chest pain, shortness of breath, mood changes.   Objective Blood pressure 130/80, pulse 71, height 5\' 5"  (1.651 m), weight 205 lb (92.987 kg), last menstrual period 03/28/2014, SpO2 98.00%.  General: No apparent distress alert and oriented x3 mood and affect normal, dressed  appropriately.  HEENT: Pupils equal, extraocular movements intact  Respiratory: Patient's speak in full sentences and does not appear short of breath  Cardiovascular: No lower extremity edema, non tender, no erythema  Skin: Warm dry intact with no signs of infection or rash on extremities or on axial skeleton.  Abdomen: Soft nontender  Neuro: Cranial nerves II through XII are intact, neurovascularly intact in all extremities with 2+ DTRs and 2+ pulses.  Lymph: No lymphadenopathy of posterior or anterior cervical chain or axillae bilaterally.  Gait normal with good balance and coordination.  MSK:  Non tender with full range of motion and good stability and symmetric strength and tone of shoulders, elbows, wrist, hip, knee and ankles bilaterally.  Foot exam shows the patient is having following of the longitudinal as well as transverse arch bilaterally. Biplanar foot shows the patient is starting to get callous formation of the first and fifth toes. Patient is starting to develop bunion on the right foot as well. Mild increase in over pronation of the hindfoot bilaterally. Neurovascularly intact   Impression and Recommendations:     This case required medical decision making of moderate complexity.

## 2014-05-21 NOTE — Patient Instructions (Signed)
Very nice to meet you and sorry for the wait today.  Ice bath 20 minutes at end of the day Iron 325mg  daily in addition to your multivitamin.  Vitamin D 2000 IU daily Turmeric 500mg  twice daily.  Exercises 3 times a week.  [employer] orthotics online or at [employer]  Otherwise favorite shoes are Bronwen Betters, Dankso and New balance greater then 700.  Come back again in 4 weeks.

## 2014-05-21 NOTE — Assessment & Plan Note (Signed)
Patient on exam today does not have any significant neuropathy noted. 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 Patient did have a regular iron level but on the lower side as well as a lower ferritin level. I do believe that supplementation and iron could be beneficial and patient will start 325 mg daily. We discussed over-the-counter medications that could be helpful as well. We discussed over-the-counter orthotics the patient continues in shoes as well as proper shoe choices. We discussed icing protocol and patient will come back again in 3-4 weeks for further evaluation.

## 2014-05-26 ENCOUNTER — Ambulatory Visit: Payer: Managed Care, Other (non HMO) | Admitting: Family Medicine

## 2014-06-18 ENCOUNTER — Ambulatory Visit: Payer: Managed Care, Other (non HMO) | Admitting: Family Medicine

## 2014-06-26 ENCOUNTER — Emergency Department (HOSPITAL_COMMUNITY)
Admission: EM | Admit: 2014-06-26 | Discharge: 2014-06-26 | Disposition: A | Discharge status: Home / Self Care | Payer: Managed Care, Other (non HMO) | Source: Home / Self Care | Attending: Family Medicine | Admitting: Family Medicine

## 2014-06-26 ENCOUNTER — Encounter (HOSPITAL_COMMUNITY): Payer: Self-pay | Admitting: Emergency Medicine

## 2014-06-26 DIAGNOSIS — L509 Urticaria, unspecified: Secondary | ICD-10-CM

## 2014-06-26 MED ORDER — PREDNISONE 5 MG PO KIT: PACK | ORAL | Status: DC

## 2014-06-26 MED ORDER — HYDROXYZINE HCL 25 MG PO TABS: 25.0000 mg | ORAL_TABLET | Freq: Three times a day (TID) | ORAL | Status: DC | PRN

## 2014-06-26 NOTE — Discharge Instructions (Signed)
Thank you for coming in today. Take prednisone daily for 12 days Use hydroxyzine as needed Continue Tagamet and loratidine Followup with your dermatologist Followup with your primary care Dr. as needed Call or go to the emergency room if you get worse, have trouble breathing, have chest pains, or palpitations.   Hives Hives are itchy, red, swollen areas of the skin. They can vary in size and location on your body. Hives can come and go for hours or several days (acute hives) or for several weeks (chronic hives). Hives do not spread from person to person (noncontagious). They may get worse with scratching, exercise, and emotional stress. CAUSES   Allergic reaction to food, additives, or drugs.  Infections, including the common cold.  Illness, such as vasculitis, lupus, or thyroid disease.  Exposure to sunlight, heat, or cold.  Exercise.  Stress.  Contact with chemicals. SYMPTOMS   Red or white swollen patches on the skin. The patches may change size, shape, and location quickly and repeatedly.  Itching.  Swelling of the hands, feet, and face. This may occur if hives develop deeper in the skin. DIAGNOSIS  Your caregiver can usually tell what is wrong by performing a physical exam. Skin or blood tests may also be done to determine the cause of your hives. In some cases, the cause cannot be determined. TREATMENT  Mild cases usually get better with medicines such as antihistamines. Severe cases may require an emergency epinephrine injection. If the cause of your hives is known, treatment includes avoiding that trigger.  HOME CARE INSTRUCTIONS   Avoid causes that trigger your hives.  Take antihistamines as directed by your caregiver to reduce the severity of your hives. Non-sedating or low-sedating antihistamines are usually recommended. Do not drive while taking an antihistamine.  Take any other medicines prescribed for itching as directed by your caregiver.  Wear loose-fitting  clothing.  Keep all follow-up appointments as directed by your caregiver. SEEK MEDICAL CARE IF:   You have persistent or severe itching that is not relieved with medicine.  You have painful or swollen joints. SEEK IMMEDIATE MEDICAL CARE IF:   You have a fever.  Your tongue or lips are swollen.  You have trouble breathing or swallowing.  You feel tightness in the throat or chest.  You have abdominal pain. These problems may be the first sign of a life-threatening allergic reaction. Call your local emergency services (911 in [place] MAKE SURE YOU:   Understand these instructions.  Will watch your condition.  Will get help right away if you are not doing well or get worse. Document Released: 11/28/2005 Document Revised: 12/03/2013 Document Reviewed: 02/21/2012 [facility] Patient Information 2015 [place], [place]. This information is not intended to replace advice given to you by your health care provider. Make sure you discuss any questions you have with your health care provider.

## 2014-06-26 NOTE — ED Provider Notes (Signed)
Joanne Li is a 53 y.o. female who presents to Urgent Care today for hives. Patient is a three-day history of hives scattered across her upper extremities lower extremities and trunk. This is similar to previous urticarial outbreaks. She's been seen by a dermatologist and allergist and has a diagnosis of idiopathic urticaria. She notes that previously over the past several days she has been camping and white water rafting which is stressful for her. She additionally notes bug bites and mosquito repellent. She denies any fevers or chills, lip involvement flulike illness. She has tried Tagamet loratadine and hydrocortisone cream. She feels well otherwise. She has an appointment with her dermatologist in 4 days.   Past Medical History  Diagnosis Date  . MVP (mitral valve prolapse)   . Insomnia   . Sleep walking   . Hyperlipidemia    History  Substance Use Topics  . Smoking status: Never Smoker   . Smokeless tobacco: Never Used  . Alcohol Use: Yes     Comment: occasionally   ROS as above Medications: No current facility-administered medications for this encounter.   Current Outpatient Prescriptions  Medication Sig Dispense Refill  . ALPRAZolam (XANAX) 0.5 MG tablet TAKE 1 TABLET BY MOUTH 3 TIMES A DAY  90 tablet  0  . ferrous sulfate 325 (65 FE) MG tablet Take 1 tablet (325 mg total) by mouth daily with breakfast.  30 tablet  5  . hydrOXYzine (ATARAX/VISTARIL) 25 MG tablet Take 1 tablet (25 mg total) by mouth every 8 (eight) hours as needed for itching.  12 tablet  0  . Iron TABS Take by mouth.      . meloxicam (MOBIC) 15 MG tablet Take 1 tablet (15 mg total) by mouth daily.  30 tablet  1  . omeprazole (PRILOSEC) 40 MG capsule Take 1 capsule (40 mg total) by mouth daily.  30 capsule  3  . PredniSONE 5 MG KIT 12 day dosepack po  1 kit  0  . simvastatin (ZOCOR) 20 MG tablet Take 1 tablet (20 mg total) by mouth daily.  90 tablet  1    Exam:  BP 142/83  Pulse 88  Temp(Src) 98.7 F  (37.1 C) (Oral)  Resp 18  SpO2 99%  LMP 05/19/2014 Gen: Well NAD nontoxic appearing HEENT: EOMI,  MMM no mucocutaneous involvement Lungs: Normal work of breathing. CTABL Heart: RRR no MRG Abd: NABS, Soft. Nontender, Nondistended Exts: Brisk capillary refill, warm and well perfused.  Skin: Multiple small individual urticaria across her bilateral upper lower extremities and trunk.  No results found for this or any previous visit (from the past 24 hour(s)). No results found.  Assessment and Plan: 53 y.o. female with urticaria. Likely more of her chronic intermittent idiopathic urticaria. Plan to treat with prednisone H1 and H2 blockers and followup with dermatology.   Discussed warning signs or symptoms. Please see discharge instructions. Patient expresses understanding.   This note was created using Systems analyst. Any transcription errors are unintended.    Gregor Hams, MD 06/26/14 [phone]

## 2014-06-26 NOTE — ED Notes (Signed)
C/o hives States she has a hx of the hives States allergist states rash cause is unknown Hives does itch  allerclear was used as tx

## 2014-06-30 ENCOUNTER — Other Ambulatory Visit: Payer: Self-pay | Admitting: Family Medicine

## 2014-06-30 NOTE — Telephone Encounter (Signed)
Med filled and faxed.  

## 2014-06-30 NOTE — Telephone Encounter (Signed)
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

## 2014-07-24 ENCOUNTER — Ambulatory Visit (INDEPENDENT_AMBULATORY_CARE_PROVIDER_SITE_OTHER): Payer: Managed Care, Other (non HMO) | Admitting: Medical

## 2014-07-24 ENCOUNTER — Encounter: Payer: Self-pay | Admitting: Medical

## 2014-07-24 ENCOUNTER — Ambulatory Visit: Payer: Managed Care, Other (non HMO) | Admitting: Family Medicine

## 2014-07-24 ENCOUNTER — Telehealth: Payer: Self-pay

## 2014-07-24 VITALS — BP 101/70 | HR 99 | Temp 98.7°F | Wt 217.2 lb

## 2014-07-24 DIAGNOSIS — T7840XA Allergy, unspecified, initial encounter: Secondary | ICD-10-CM

## 2014-07-24 MED ORDER — METHYLPREDNISOLONE ACETATE 40 MG/ML IJ SUSP
40.0000 mg | Freq: Once | INTRAMUSCULAR | Status: AC
  Administered 2014-07-24: 40 mg via INTRAMUSCULAR

## 2014-07-24 NOTE — Progress Notes (Signed)
Pre-visit discussion using our clinic review tool. No additional management support is needed unless otherwise documented below in the visit note.  

## 2014-07-24 NOTE — Telephone Encounter (Signed)
C/o:  Mild hives on back of leg and throat "swelling".  Top lip said to be swollen, but patient states that other people cannot notice.  Denies shortness of breath and tongue swelling.  Able to swallow without difficult.  Did not sound to be in distress over the phone.   She was seen in the UC on 06/26/14 for hives.  Was given prednisone x 10 days and hives went away.  Hives returned 2 days later.  Throat swelling noticed only last night.  Pt states she's unsure of whether it's weight gain or not, but last night her lips felt funny and it made it difficult for her to sleep because she was unsure of cause.  She has been allergy tested before, but no allergies have yet been discovered.     Medication list: reviewed and updated  Hx. Anxiety and hives  Has taken xanax this morning and has an epipen on hand.    Advice:  She has an appointment with Dr. Birdie Riddle today at 2:45 pm.  Given Dr. Virgil Benedict schedule, patient was placed on Edward's schedule at 2:45 pm.  Pt was highly encouraged that if her symptoms worsen for her to go to the ER or call EMS if in immediate danger.  She also knows to use if epipen if needed.  Pt stated understanding and agreed.

## 2014-07-24 NOTE — Assessment & Plan Note (Signed)
Possible in light of recent lip swelling last night and mild rash present now. Will give depomedrol im. Advise pt to continue tagamet and hydroxzine. 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 I did ask her to get allergist name and notify us so we can get old allergy testing results.

## 2014-07-24 NOTE — Progress Notes (Signed)
Subjective:    Patient ID: Joanne Li, female    DOB: Mar 19, 1961, 53 y.o.   MRN: [id number]  HPI  Pt in with report of recent scattered  Raised red areas all over her body at times. Pt this started recently around July 10 th. Pt states she was itching a lot at that time. First started around back of her neck then spread. Areas of redness have waxed and waned in appearance and severity. Symptoms worse in morning often. Often time seems to be located where clothing applies pressure. Such elastic waist band and bra region. No association with soap, creams, detergents, foods,animals or insect bites. 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 Type unkown to pt. Pt also dermatologist Dr. Nevada Crane. He tagamet and hydroxyzine. Last night she thought lip was swelling and she thought tongue was possibly swollen. Pt has epi pen  in event severe reaction. Never used epi-pen before. Pt is not diabetic. Presently has just 5-7 cirular reg area on lt thigh. They itch faintly.  Review of Systems  Constitutional: Negative for fever, chills and fatigue.  HENT: Negative for congestion, ear discharge, ear pain, facial swelling, mouth sores, nosebleeds, postnasal drip, sinus pressure, sneezing, sore throat and trouble swallowing.        Last nights lips swelled briefly then this am normal.  Eyes: Negative for itching.  Respiratory: Negative for cough, choking, chest tightness, shortness of breath, wheezing and stridor.   Cardiovascular: Negative for chest pain and palpitations.  Gastrointestinal: Negative.   Musculoskeletal: Negative.   Allergic/Immunologic: Positive for environmental allergies. Negative for food allergies and immunocompromised state.       To one type of tree pt not sure which type.  Neurological: Negative for dizziness, syncope, facial asymmetry, weakness, numbness and headaches.  Hematological: Negative for adenopathy. Does not bruise/bleed easily.    Psychiatric/Behavioral: Negative.        But at one point pt mentioned briefly that she thought maybe nerves may effect rash.       Objective:   Physical Exam  Constitutional: She is oriented to person, place, and time. She appears well-developed and well-nourished. No distress.  HENT:  Head: Normocephalic and atraumatic.  Right Ear: External ear normal.  Left Ear: External ear normal.  Nose: Nose normal.  Mouth/Throat: Oropharynx is clear and moist. No oropharyngeal exudate.  No swelling of lips and buccal mucosa appears normal.  Eyes: Conjunctivae and EOM are normal. Pupils are equal, round, and reactive to light. Right eye exhibits no discharge. Left eye exhibits no discharge. No scleral icterus.  Neck: Normal range of motion. Neck supple. No JVD present. No tracheal deviation present. No thyromegaly present.  Cardiovascular: Normal rate, regular rhythm and normal heart sounds.   Pulmonary/Chest: Effort normal and breath sounds normal. No stridor. No respiratory distress. She has no wheezes. She has no rales. She exhibits no tenderness.  Abdominal: Soft. Bowel sounds are normal.  Musculoskeletal: Normal range of motion.  Lymphadenopathy:    She has cervical adenopathy.  Neurological: She is alert and oriented to person, place, and time. No cranial nerve deficit. Coordination normal.  Skin: Skin is warm. Rash noted. She is not diaphoretic. No erythema. No pallor.  Faint red rash. Circular area about 1cm in diameter. Not raised. Not warm to touch. No vesicles seen.  Psychiatric: She has a normal mood and affect. Her behavior is normal. Judgment and thought content normal.  Assessment & Plan:

## 2014-07-24 NOTE — Patient Instructions (Addendum)
For our recent lip swelling with rash we gave depo-medrol injection for possible allergic reaction. Although cause  not determined today. By your history also appears you may have had neurodermatitis. I would recommend continuing tagamet and hydroxyzine. Please sign release of info form so we can obtain your allergist records/allergy testing. Follow up 7 days any persisting symptoms or as needed.

## 2014-07-24 NOTE — Progress Notes (Signed)
Pre visit review using our clinic review tool, if applicable. No additional management support is needed unless otherwise documented below in the visit note. 

## 2014-09-03 ENCOUNTER — Emergency Department (HOSPITAL_COMMUNITY)
Admission: EM | Admit: 2014-09-03 | Discharge: 2014-09-04 | Disposition: A | Discharge status: Home / Self Care | Payer: Managed Care, Other (non HMO) | Attending: Emergency Medicine | Admitting: Emergency Medicine

## 2014-09-03 ENCOUNTER — Encounter (HOSPITAL_COMMUNITY): Payer: Self-pay | Admitting: Emergency Medicine

## 2014-09-03 DIAGNOSIS — Z79899 Other long term (current) drug therapy: Secondary | ICD-10-CM | POA: Insufficient documentation

## 2014-09-03 DIAGNOSIS — T783XXA Angioneurotic edema, initial encounter: Secondary | ICD-10-CM | POA: Diagnosis not present

## 2014-09-03 DIAGNOSIS — R220 Localized swelling, mass and lump, head: Secondary | ICD-10-CM | POA: Insufficient documentation

## 2014-09-03 DIAGNOSIS — E785 Hyperlipidemia, unspecified: Secondary | ICD-10-CM | POA: Insufficient documentation

## 2014-09-03 DIAGNOSIS — Z8659 Personal history of other mental and behavioral disorders: Secondary | ICD-10-CM | POA: Insufficient documentation

## 2014-09-03 DIAGNOSIS — R221 Localized swelling, mass and lump, neck: Secondary | ICD-10-CM

## 2014-09-03 DIAGNOSIS — Z8679 Personal history of other diseases of the circulatory system: Secondary | ICD-10-CM | POA: Diagnosis not present

## 2014-09-03 DIAGNOSIS — T4995XA Adverse effect of unspecified topical agent, initial encounter: Secondary | ICD-10-CM | POA: Diagnosis not present

## 2014-09-03 LAB — CBC WITH DIFFERENTIAL/PLATELET
Basophils Absolute: 0 10*3/uL (ref 0.0–0.1)
Basophils Relative: 0 % (ref 0–1)
EOS ABS: 0.1 10*3/uL (ref 0.0–0.7)
EOS PCT: 2 % (ref 0–5)
HCT: 42.2 % (ref 36.0–46.0)
HEMOGLOBIN: 14 g/dL (ref 12.0–15.0)
LYMPHS ABS: 2.2 10*3/uL (ref 0.7–4.0)
Lymphocytes Relative: 33 % (ref 12–46)
MCH: 28.1 pg (ref 26.0–34.0)
MCHC: 33.2 g/dL (ref 30.0–36.0)
MCV: 84.6 fL (ref 78.0–100.0)
MONOS PCT: 7 % (ref 3–12)
Monocytes Absolute: 0.5 10*3/uL (ref 0.1–1.0)
Neutro Abs: 3.7 10*3/uL (ref 1.7–7.7)
Neutrophils Relative %: 58 % (ref 43–77)
PLATELETS: 279 10*3/uL (ref 150–400)
RBC: 4.99 MIL/uL (ref 3.87–5.11)
RDW: 15.8 % — ABNORMAL HIGH (ref 11.5–15.5)
WBC: 6.5 10*3/uL (ref 4.0–10.5)

## 2014-09-03 LAB — COMPREHENSIVE METABOLIC PANEL
ALK PHOS: 66 U/L (ref 39–117)
ALT: 18 U/L (ref 0–35)
AST: 30 U/L (ref 0–37)
Albumin: 4.1 g/dL (ref 3.5–5.2)
Anion gap: 13 (ref 5–15)
BUN: 15 mg/dL (ref 6–23)
CO2: 25 meq/L (ref 19–32)
Calcium: 9.5 mg/dL (ref 8.4–10.5)
Chloride: 100 mEq/L (ref 96–112)
Creatinine, Ser: 0.88 mg/dL (ref 0.50–1.10)
GFR, EST AFRICAN AMERICAN: 85 mL/min — AB (ref >90)
GFR, EST NON AFRICAN AMERICAN: 74 mL/min — AB (ref >90)
GLUCOSE: 88 mg/dL (ref 70–99)
POTASSIUM: 5.1 meq/L (ref 3.7–5.3)
Sodium: 138 mEq/L (ref 137–147)
Total Bilirubin: <0.2 mg/dL — ABNORMAL LOW (ref 0.3–1.2)
Total Protein: 7.8 g/dL (ref 6.0–8.3)

## 2014-09-03 MED ORDER — DIPHENHYDRAMINE HCL 50 MG/ML IJ SOLN
50.0000 mg | Freq: Once | INTRAMUSCULAR | Status: AC
  Administered 2014-09-03: 50 mg via INTRAVENOUS
  Filled 2014-09-03: qty 1

## 2014-09-03 MED ORDER — METHYLPREDNISOLONE SODIUM SUCC 125 MG IJ SOLR
125.0000 mg | Freq: Once | INTRAMUSCULAR | Status: AC
  Administered 2014-09-03: 125 mg via INTRAVENOUS
  Filled 2014-09-03: qty 2

## 2014-09-03 MED ORDER — SODIUM CHLORIDE 0.9 % IV BOLUS (SEPSIS)
1000.0000 mL | Freq: Once | INTRAVENOUS | Status: AC
  Administered 2014-09-03: 1000 mL via INTRAVENOUS

## 2014-09-03 MED ORDER — FAMOTIDINE IN NACL 20-0.9 MG/50ML-% IV SOLN
20.0000 mg | Freq: Once | INTRAVENOUS | Status: AC
  Administered 2014-09-03: 20 mg via INTRAVENOUS
  Filled 2014-09-03: qty 50

## 2014-09-03 NOTE — ED Notes (Addendum)
PT reports breathing ok but upper lip very swollen starting at 1630 and worsening. Denies throat swelling or difficulty breathing. Hx of hives. Sees allergist. 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

## 2014-09-03 NOTE — ED Provider Notes (Signed)
CSN: [id number]     Arrival date & time 09/03/14  2022 History   First MD Initiated Contact with Patient 09/03/14 2049     Chief Complaint  Patient presents with  . Angioedema     (Consider location/radiation/quality/duration/timing/severity/associated sxs/prior Treatment) The history is provided by the patient.  Joanne Li is a 53 y.o. female hx of insomnia, HL, angioedema, here with angioedema. She was in the office and was spraying something around 4:30 PM and then noticed upper lip swelling. Denies throat swelling or trouble breathing. Had hx of angioedema in the past and had allergy testing that didn't identify a cause.    Past Medical History  Diagnosis Date  . MVP (mitral valve prolapse)   . Insomnia   . Sleep walking   . Hyperlipidemia    Past Surgical History  Procedure Laterality Date  . Shoulder surgery  2009    right   Family History  Problem Relation Age of Onset  . Arthritis Mother   . Arthritis Father   . Coronary artery disease Father     s/p quadruple bypass  . Colon cancer Neg Hx    History  Substance Use Topics  . Smoking status: Never Smoker   . Smokeless tobacco: Never Used  . Alcohol Use: Yes     Comment: occasionally   OB History   Grav Para Term Preterm Abortions TAB SAB Ect Mult Living   1 1             Review of Systems  HENT:       Upper lip swelling   All other systems reviewed and are negative.     Allergies  Metronidazole and Nitrofurantoin  Home Medications   Prior to Admission medications   Medication Sig Start Date End Date Taking? Authorizing Provider  ALPRAZolam Duanne Moron) 0.5 MG tablet Take 0.25 mg by mouth at bedtime as needed for anxiety.   Yes Historical Provider, MD  cimetidine (TAGAMET) 400 MG tablet Take 400 mg by mouth 2 (two) times daily.    Yes Historical Provider, MD  EPINEPHrine (EPIPEN) 0.3 mg/0.3 mL IJ SOAJ injection Inject 0.3 mg into the muscle once.   Yes Historical Provider, MD  hydrOXYzine  (ATARAX/VISTARIL) 25 MG tablet Take 1 tablet (25 mg total) by mouth every 8 (eight) hours as needed for itching. 06/26/14  Yes Gregor Hams, MD  meloxicam (MOBIC) 15 MG tablet Take 15 mg by mouth daily as needed for pain. 05/19/14  Yes Midge Minium, MD  naproxen sodium (ANAPROX) 220 MG tablet Take 220 mg by mouth daily as needed (pain).   Yes Historical Provider, MD  omeprazole (PRILOSEC) 40 MG capsule Take 40 mg by mouth daily as needed. 12/04/13  Yes Midge Minium, MD  simvastatin (ZOCOR) 20 MG tablet Take 1 tablet (20 mg total) by mouth daily. 03/19/14  Yes Midge Minium, MD   BP 112/65  Pulse 78  Resp 18  SpO2 96%  LMP 08/26/2014 Physical Exam  Nursing note and vitals reviewed. Constitutional: She is oriented to person, place, and time. She appears well-nourished.  Comfortable   HENT:  Head: Normocephalic.  + upper lip swelling, OP clear   Eyes: Conjunctivae are normal. Pupils are equal, round, and reactive to light.  Neck: Normal range of motion. Neck supple.  No stridor   Cardiovascular: Normal rate, regular rhythm and normal heart sounds.   Pulmonary/Chest: Effort normal and breath sounds normal. No respiratory distress. She has no wheezes. She  has no rales.  Abdominal: Soft. Bowel sounds are normal. She exhibits no distension. There is no tenderness. There is no rebound.  Musculoskeletal: Normal range of motion. She exhibits no edema and no tenderness.  Neurological: She is alert and oriented to person, place, and time. No cranial nerve deficit. Coordination normal.  Skin: Skin is warm and dry.  Psychiatric: She has a normal mood and affect. Her behavior is normal. Thought content normal.    ED Course  Procedures (including critical care time) Labs Review Labs Reviewed  COMPREHENSIVE METABOLIC PANEL - Abnormal; Notable for the following:    Total Bilirubin <0.2 (*)    GFR calc non Af Amer 74 (*)    GFR calc Af Amer 85 (*)    All other components within normal  limits  CBC WITH DIFFERENTIAL - Abnormal; Notable for the following:    RDW 15.8 (*)    All other components within normal limits    Imaging Review No results found.   EKG Interpretation None      MDM   Final diagnoses:  None    [name] is a 53 y.o. female here with angioedema. Will give solumedrol, pepcid, benadryl and observe.   10 PM Swelling stable. Will continue to monitor.   11:43 PM Signed out to Dr. Tawnya Crook to reassess patient around 1 am.      Wandra Arthurs, MD 09/03/14 [phone]

## 2014-09-03 NOTE — ED Notes (Signed)
MD at bedside. 

## 2014-09-04 MED ORDER — DIPHENHYDRAMINE HCL 25 MG PO TABS: 25.0000 mg | ORAL_TABLET | Freq: Four times a day (QID) | ORAL | Status: DC

## 2014-09-04 MED ORDER — FAMOTIDINE 20 MG PO TABS: 20.0000 mg | ORAL_TABLET | Freq: Two times a day (BID) | ORAL | Status: DC

## 2014-09-04 MED ORDER — PREDNISONE 10 MG PO TABS: 20.0000 mg | ORAL_TABLET | Freq: Every day | ORAL | Status: DC

## 2014-09-04 NOTE — ED Notes (Signed)
MD at bedside. 

## 2014-09-04 NOTE — Discharge Instructions (Signed)
Angioedema °Angioedema is a sudden swelling of tissues, often of the skin. It can occur on the face or genitals or in the abdomen or other body parts. The swelling usually develops over a short period and gets better in 24 to 48 hours. It often begins during the night and is found when the person wakes up. The person may also get red, itchy patches of skin (hives). Angioedema can be dangerous if it involves swelling of the air passages.  °Depending on the cause, episodes of angioedema may only happen once, come back in unpredictable patterns, or repeat for several years and then gradually fade away.  °CAUSES  °Angioedema can be caused by an allergic reaction to various triggers. It can also result from nonallergic causes, including reactions to drugs, immune system disorders, viral infections, or an abnormal gene that is passed to you from your parents (hereditary). For some people with angioedema, the cause is unknown.  °Some things that can trigger angioedema include:  °· Foods.   °· Medicines, such as ACE inhibitors, ARBs, nonsteroidal anti-inflammatory agents, or estrogen.   °· Latex.   °· Animal saliva.   °· Insect stings.   °· Dyes used in X-rays.   °· Mild injury.   °· Dental work. °· Surgery. °· Stress.   °· Sudden changes in temperature.   °· Exercise. °SIGNS AND SYMPTOMS  °· Swelling of the skin. °· Hives. If these are present, there is also intense itching. °· Redness in the affected area.   °· Pain in the affected area. °· Swollen lips or tongue. °· Breathing problems. This may happen if the air passages swell. °· Wheezing. °If internal organs are involved, there may be:  °· Nausea.   °· Abdominal pain.   °· Vomiting.   °· Difficulty swallowing.   °· Difficulty passing urine. °DIAGNOSIS  °· Your health care provider will examine the affected area and take a medical and family history. °· Various tests may be done to help determine the cause. Tests may include: °¨ Allergy skin tests to see if the problem  is an allergic reaction.   °¨ Blood tests to check for hereditary angioedema.   °¨ Tests to check for underlying diseases that could cause the condition.   °· A review of your medicines, including over-the-counter medicines, may be done. °TREATMENT  °Treatment will depend on the cause of the angioedema. Possible treatments include:  °· Removal of anything that triggered the condition (such as stopping certain medicines).   °· Medicines to treat symptoms or prevent attacks. Medicines given may include:   °¨ Antihistamines.   °¨ Epinephrine injection.   °¨ Steroids.   °· Hospitalization may be required for severe attacks. If the air passages are affected, it can be an emergency. Tubes may need to be placed to keep the airway open. °HOME CARE INSTRUCTIONS  °· Take all medicines as directed by your health care provider. °· If you were given medicines for emergency allergy treatment, always carry them with you. °· Wear a medical bracelet as directed by your health care provider.   °· Avoid known triggers. °SEEK MEDICAL CARE IF:  °· You have repeat attacks of angioedema.   °· Your attacks are more frequent or more severe despite preventive measures.   °· You have hereditary angioedema and are considering having children. It is important to discuss with your health care provider the risks of passing the condition on to your children. °SEEK IMMEDIATE MEDICAL CARE IF:  °· You have severe swelling of the mouth, tongue, or lips. °· You have difficulty breathing.   °· You have difficulty swallowing.   °· You faint. °MAKE   SURE YOU: °· Understand these instructions. °· Will watch your condition. °· Will get help right away if you are not doing well or get worse. °Document Released: 02/06/2002 Document Revised: 04/14/2014 Document Reviewed: 07/22/2013 °[employer]® Patient Information ©2015 [employer]. This information is not intended to replace advice given to you by your health care provider. Make sure you discuss any questions  you have with your health care provider. ° °

## 2014-09-04 NOTE — ED Notes (Signed)
Patient discharged with all personal belongings. 

## 2014-09-04 NOTE — ED Provider Notes (Signed)
The patient was observed for 3 hours. She had mild improvement of symptoms. No worsening of symptoms including no posterior oropharyngeal symptoms. We'll DC home with 3 days of Benadryl, Pepcid, prednisone. Return precautions given for new or worsening symptoms.  1. Angioedema, initial encounter      Ernestina Patches, MD 09/04/14 [phone]

## 2014-10-13 ENCOUNTER — Encounter (HOSPITAL_COMMUNITY): Payer: Self-pay | Admitting: Emergency Medicine

## 2014-10-29 ENCOUNTER — Telehealth: Payer: Self-pay | Admitting: General Practice

## 2014-10-29 MED ORDER — ALPRAZOLAM 0.5 MG PO TABS: 0.2500 mg | ORAL_TABLET | Freq: Every evening | ORAL | Status: DC | PRN

## 2014-10-29 NOTE — Telephone Encounter (Signed)
Last OV 05-19-14 Alprazolam last filled7-20-15 #90 with 0  CSC signed, no UDS

## 2014-10-29 NOTE — Telephone Encounter (Signed)
Med filled and faxed.  

## 2014-10-29 NOTE — Telephone Encounter (Signed)
Ok for 90

## 2014-12-24 LAB — HM PAP SMEAR: HM Pap smear: ABNORMAL

## 2014-12-29 ENCOUNTER — Encounter: Payer: Self-pay | Admitting: *Deleted

## 2015-03-17 ENCOUNTER — Other Ambulatory Visit: Payer: Self-pay | Admitting: Family Medicine

## 2015-03-17 DIAGNOSIS — Z1231 Encounter for screening mammogram for malignant neoplasm of breast: Secondary | ICD-10-CM

## 2015-05-05 ENCOUNTER — Ambulatory Visit (HOSPITAL_COMMUNITY)
Admission: RE | Admit: 2015-05-05 | Discharge: 2015-05-05 | Disposition: A | Discharge status: Home / Self Care | Payer: Managed Care, Other (non HMO) | Source: Ambulatory Visit | Attending: Family Medicine | Admitting: Family Medicine

## 2015-05-05 DIAGNOSIS — Z1231 Encounter for screening mammogram for malignant neoplasm of breast: Secondary | ICD-10-CM | POA: Diagnosis not present

## 2015-06-25 ENCOUNTER — Encounter: Payer: Managed Care, Other (non HMO) | Admitting: Family Medicine

## 2015-07-03 ENCOUNTER — Telehealth: Payer: Self-pay | Admitting: Family Medicine

## 2015-07-03 NOTE — Telephone Encounter (Signed)
Caller name: Shawnique Relation to pt: Call back number: [phone] Pharmacy:  Reason for call:   Patient would like to know the name of the podiatrist that we had referred her to a couple of years ago. I couldn't find anything in chart

## 2015-07-06 NOTE — Telephone Encounter (Signed)
We do not have a record of who she saw previously but I usually refer to Dr Fritzi Mandes

## 2015-07-06 NOTE — Telephone Encounter (Signed)
Please advise 

## 2015-07-06 NOTE — Telephone Encounter (Signed)
LMOM informing Pt that per our records we did not see where we have ever referred her to a podiatrist. However, informed her if needed we can refer her to Dr. Fritzi Mandes. Instructed her to call if she has any other questions or concerns.

## 2015-07-31 ENCOUNTER — Telehealth: Payer: Self-pay | Admitting: Family Medicine

## 2015-07-31 NOTE — Telephone Encounter (Signed)
pre visit letter mailed 07/30/15 °

## 2015-08-10 ENCOUNTER — Encounter: Payer: Managed Care, Other (non HMO) | Admitting: Family Medicine

## 2015-08-19 ENCOUNTER — Telehealth: Payer: Self-pay | Admitting: Family Medicine

## 2015-08-19 ENCOUNTER — Telehealth: Payer: Self-pay | Admitting: *Deleted

## 2015-08-19 ENCOUNTER — Ambulatory Visit (INDEPENDENT_AMBULATORY_CARE_PROVIDER_SITE_OTHER): Payer: Managed Care, Other (non HMO) | Admitting: Internal Medicine

## 2015-08-19 ENCOUNTER — Encounter: Payer: Self-pay | Admitting: Internal Medicine

## 2015-08-19 VITALS — BP 124/78 | HR 90 | Temp 98.2°F | Ht 65.0 in | Wt 213.0 lb

## 2015-08-19 DIAGNOSIS — R002 Palpitations: Secondary | ICD-10-CM | POA: Diagnosis not present

## 2015-08-19 DIAGNOSIS — T7840XD Allergy, unspecified, subsequent encounter: Secondary | ICD-10-CM | POA: Diagnosis not present

## 2015-08-19 DIAGNOSIS — F411 Generalized anxiety disorder: Secondary | ICD-10-CM

## 2015-08-19 MED ORDER — PREDNISONE 10 MG PO TABS: ORAL_TABLET | ORAL | Status: DC

## 2015-08-19 MED ORDER — ALPRAZOLAM 0.5 MG PO TABS: 0.2500 mg | ORAL_TABLET | Freq: Every evening | ORAL | Status: DC | PRN

## 2015-08-19 NOTE — Telephone Encounter (Signed)
Patient rescheduled appointment.

## 2015-08-19 NOTE — Progress Notes (Signed)
Subjective:    Patient ID: Joanne Li, female    DOB: [birth year], 54 y.o.   MRN: [id number]  HPI   Here to f/u with c/o palpitations, has been on ongoing minor symptoms for many years, but recently x 1 mo occuring several seconds long fluttering feeling at the upper mid chest, mult times per day.  No assoc symptoms - Pt denies chest pain, increased sob or doe, wheezing, orthopnea, PND, increased LE swelling, diaphoresis, n/v, dizziness or syncope.  No hx of eval prior or known arrythmias or heart disease.  Quite concerning b/c so frequent now.  Denies hyper or hypo thyroid symptoms such as voice, skin or hair change. Last TSH normal dec 2015.  Has hx of mild anemia in past, no recent bleeding or bruising, last cbc normal.   No other significant caffeine or otc decongestant use. Denies worsening depressive symptoms, suicidal ideation, or panic; though has ongoing stressors.  Also with hx of chronic urticaria, worse last wk, somewhat better this wk, but is asking for at least some short term improvement with short course low dose prednisone. Lab Results  Component Value Date   TSH 1.31 12/04/2013   Lab Results  Component Value Date   WBC 6.5 09/03/2014   HGB 14.0 09/03/2014   HCT 42.2 09/03/2014   MCV 84.6 09/03/2014   PLT 279 09/03/2014   Past Medical History  Diagnosis Date  . MVP (mitral valve prolapse)   . Insomnia   . Sleep walking   . Hyperlipidemia    Past Surgical History  Procedure Laterality Date  . Shoulder surgery  2009    right    reports that she has never smoked. She has never used smokeless tobacco. She reports that she drinks alcohol. She reports that she does not use illicit drugs. family history includes Arthritis in her father and mother; Coronary artery disease in her father. There is no history of Colon cancer. Allergies  Allergen Reactions  . Metronidazole     Upset stomach  . Nitrofurantoin     REACTION: sick to stomach  \ Current Outpatient  Prescriptions on File Prior to Visit  Medication Sig Dispense Refill  . cimetidine (TAGAMET) 400 MG tablet Take 400 mg by mouth 2 (two) times daily.     . diphenhydrAMINE (BENADRYL) 25 MG tablet Take 1 tablet (25 mg total) by mouth every 6 (six) hours. 12 tablet 0  . EPINEPHrine (EPIPEN) 0.3 mg/0.3 mL IJ SOAJ injection Inject 0.3 mg into the muscle once.    . famotidine (PEPCID) 20 MG tablet Take 1 tablet (20 mg total) by mouth 2 (two) times daily. 6 tablet 0  . hydrOXYzine (ATARAX/VISTARIL) 25 MG tablet Take 1 tablet (25 mg total) by mouth every 8 (eight) hours as needed for itching. 12 tablet 0  . omeprazole (PRILOSEC) 40 MG capsule Take 40 mg by mouth daily as needed.    . meloxicam (MOBIC) 15 MG tablet Take 15 mg by mouth daily as needed for pain.    . naproxen sodium (ANAPROX) 220 MG tablet Take 220 mg by mouth daily as needed (pain).    . simvastatin (ZOCOR) 20 MG tablet Take 1 tablet (20 mg total) by mouth daily. (Patient not taking: Reported on 08/19/2015) 90 tablet 1   No current facility-administered medications on file prior to visit.    Review of Systems  Constitutional: Negative for unusual diaphoresis or night sweats HENT: Negative for ringing in ear or discharge Eyes: Negative for double vision  or worsening visual disturbance.  Respiratory: Negative for choking and stridor.   Gastrointestinal: Negative for vomiting or other signifcant bowel change Genitourinary: Negative for hematuria or change in urine volume.  Musculoskeletal: Negative for other MSK pain or swelling Skin: Negative for color change and worsening wound.  Neurological: Negative for tremors and numbness other than noted  Psychiatric/Behavioral: Negative for decreased concentration or agitation other than above       Objective:   Physical Exam BP 124/78 mmHg  Pulse 90  Temp(Src) 98.2 F (36.8 C) (Oral)  Ht 5\' 5"  (1.651 m)  Wt 213 lb (96.616 kg)  BMI 35.44 kg/m2  SpO2 97% VS noted, not ill  appearing Constitutional: Pt appears in no significant distress HENT: Head: NCAT.  Right Ear: External ear normal.  Left Ear: External ear normal.  Eyes: . Pupils are equal, round, and reactive to light. Conjunctivae and EOM are normal Neck: Normal range of motion. Neck supple.  Cardiovascular: Normal rate and regular rhythm.   Pulmonary/Chest: Effort normal and breath sounds without rales or wheezing.  Abd:  Soft, NT, ND, + BS Neurological: Pt is alert. Not confused , motor grossly intact Skin: Skin is warm. + few diffuse hive like rash to extremities and torso, no LE edema Psychiatric: Pt behavior is normal. No agitation.     Assessment & Plan:

## 2015-08-19 NOTE — Patient Instructions (Signed)
Please take all new medication as prescribed - the prednisone  Please continue all other medications as before, and refills have been done if requested - the xanax  Please have the pharmacy call with any other refills you may need.  Please keep your appointments with your specialists as you may have planned  You will be contacted regarding the referral for: Holter Monitor (48 hr)

## 2015-08-19 NOTE — Telephone Encounter (Signed)
[facility] - Client [place]    --------------------------------------------------------------------------------   Patient Name: Joanne Li  Gender: Female  DOB: 1961-03-09   Age: 54 Y 2 M 30 D  Return Phone Number: [phone] (Primary)  Address:     City/State/Zip: [place]  [address]   Client [facility] - Client  Client Site [facility] - Day  Physician Tabori, Lena Type Call  Call Type Triage / Clinical  Relationship To Patient Self  Return Phone Number [phone] (Primary)  Chief Complaint Heart palpitations or irregular heartbeat  Initial Comment Caller states is having heart palpitations, and heartburn.   PreDisposition Call Doctor       Nurse Assessment  Nurse: Amalia Hailey, RN, Lenna Sciara Date/Time Eilene Ghazi Time): 08/19/2015 3:39:09 PM  Confirm and document reason for call. If symptomatic, describe symptoms. ---Caller states is having heart palpitations, and heartburn.    Has the patient traveled out of the country within the last 30 days? ---Not Applicable    Does the patient require triage? ---Yes    Related visit to physician within the last 2 weeks? ---No    Does the PT have any chronic conditions? (i.e. diabetes, asthma, etc.) ---Yes    List chronic conditions. 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 ---No           Guidelines          Guideline Title Affirmed Question Affirmed Notes Nurse Date/Time (Eastern Time)  Heart Rate and Heartbeat Questions History of heart disease (i.e., heart attack, bypass surgery, angina, angioplasty, CHF) (Exception: brief heart beat symptoms that went away and now feels well)    Amalia Hailey, RN, Lenna Sciara 08/19/2015 3:42:38 PM    Disp. Time Eilene Ghazi Time) Disposition Final User    08/19/2015 3:17:03 PM Attempt made -  message left   Amalia Hailey, RN, Lenna Sciara      08/19/2015 3:52:27 PM See Physician within 4 Hours (or PCP triage) Yes Amalia Hailey, RN, Lenna Sciara            Caller Understands: Yes  Disagree/Comply: Comply       Care Advice Given Per Guideline        SEE PHYSICIAN WITHIN 4 HOURS (or PCP triage): * IF OFFICE WILL BE OPEN: You need to be seen within the next 3 or 4 hours. Call your doctor's office now or as soon as it opens. BRING MEDICINES: CALL BACK IF:    After Care Instructions Given        Call Event Type User Date / Time Description        --------------------------------------------------------------------------------

## 2015-08-19 NOTE — Telephone Encounter (Signed)
Called to follow up with patient. She states that she has been having heart palpitations for 1 and 1/2 month.  States it feels like a flutter in her throat.  She notices it more whenever she is sitting in car driving, sitting on the couch and when laying down in bed.  She says heartburn comes and goes.  She's not currently experiencing heartburn at the time of call.  She denies chest pain, shortness of breath, jaw pain, n/a, arm pain, and abdominal pain.  She says that stress has increased some at work, but says palpitations were occuring well before then.  She does not sound to be in distress over the phone.  She was driving at the time.  States she feels comfortable going to 6:15 pm appointment today with Dr. Jenny Reichmann.  States she does not feel the need to go to hospital.  Pt will follow up with Dr. Birdie Riddle as needed.

## 2015-08-19 NOTE — Assessment & Plan Note (Signed)
ECG reviewed as per emr, etiology unclear, diff includes svt vs pac/pvc or even afib, for 48 hr holter, also advised cbc and tsh but pt defers to f/u nov 2016 with PCP

## 2015-08-19 NOTE — Progress Notes (Signed)
Pre visit review using our clinic review tool, if applicable. No additional management support is needed unless otherwise documented below in the visit note. 

## 2015-08-20 ENCOUNTER — Encounter: Payer: Managed Care, Other (non HMO) | Admitting: Family Medicine

## 2015-08-20 ENCOUNTER — Ambulatory Visit: Payer: Managed Care, Other (non HMO) | Admitting: Family Medicine

## 2015-08-20 NOTE — Assessment & Plan Note (Signed)
stable overall by history and exam, recent data reviewed with pt, and pt to continue medical treatment as before,  to f/u any worsening symptoms or concerns Lab Results  Component Value Date   WBC 6.5 09/03/2014   HGB 14.0 09/03/2014   HCT 42.2 09/03/2014   PLT 279 09/03/2014   GLUCOSE 88 09/03/2014   CHOL 159 05/19/2014   TRIG 46.0 05/19/2014   HDL 57.30 05/19/2014   LDLDIRECT 154.5 12/04/2013   LDLCALC 93 05/19/2014   ALT 18 09/03/2014   AST 30 09/03/2014   NA 138 09/03/2014   K 5.1 09/03/2014   CL 100 09/03/2014   CREATININE 0.88 09/03/2014   BUN 15 09/03/2014   CO2 25 09/03/2014   TSH 1.31 12/04/2013   HGBA1C 4.9 03/31/2014

## 2015-08-20 NOTE — Assessment & Plan Note (Signed)
Ok for short course low dose predpac asd,  to f/u any worsening symptoms or concerns

## 2015-10-26 ENCOUNTER — Telehealth: Payer: Self-pay

## 2015-10-27 ENCOUNTER — Telehealth: Payer: Self-pay

## 2015-10-27 ENCOUNTER — Encounter: Payer: Self-pay | Admitting: Family Medicine

## 2015-10-27 ENCOUNTER — Ambulatory Visit (INDEPENDENT_AMBULATORY_CARE_PROVIDER_SITE_OTHER): Payer: Managed Care, Other (non HMO) | Admitting: Family Medicine

## 2015-10-27 VITALS — BP 122/80 | HR 76 | Temp 98.0°F | Resp 16 | Ht 65.0 in | Wt 207.0 lb

## 2015-10-27 DIAGNOSIS — Z0000 Encounter for general adult medical examination without abnormal findings: Secondary | ICD-10-CM

## 2015-10-27 DIAGNOSIS — L509 Urticaria, unspecified: Secondary | ICD-10-CM

## 2015-10-27 DIAGNOSIS — K219 Gastro-esophageal reflux disease without esophagitis: Secondary | ICD-10-CM | POA: Diagnosis not present

## 2015-10-27 LAB — CBC WITH DIFFERENTIAL/PLATELET
BASOS ABS: 0 10*3/uL (ref 0.0–0.1)
Basophils Relative: 0.5 % (ref 0.0–3.0)
EOS ABS: 0.1 10*3/uL (ref 0.0–0.7)
Eosinophils Relative: 2.3 % (ref 0.0–5.0)
HCT: 42.8 % (ref 36.0–46.0)
Hemoglobin: 13.5 g/dL (ref 12.0–15.0)
Lymphocytes Relative: 26.2 % (ref 12.0–46.0)
Lymphs Abs: 1.3 10*3/uL (ref 0.7–4.0)
MCHC: 31.5 g/dL (ref 30.0–36.0)
MCV: 81.2 fl (ref 78.0–100.0)
Monocytes Absolute: 0.4 10*3/uL (ref 0.1–1.0)
Monocytes Relative: 8.3 % (ref 3.0–12.0)
NEUTROS ABS: 3 10*3/uL (ref 1.4–7.7)
NEUTROS PCT: 62.7 % (ref 43.0–77.0)
Platelets: 270 10*3/uL (ref 150.0–400.0)
RBC: 5.27 Mil/uL — AB (ref 3.87–5.11)
RDW: 15.8 % — ABNORMAL HIGH (ref 11.5–15.5)
WBC: 4.8 10*3/uL (ref 4.0–10.5)

## 2015-10-27 LAB — LIPID PANEL
CHOLESTEROL: 193 mg/dL (ref 0–200)
HDL: 58.5 mg/dL (ref >39.00)
LDL Cholesterol: 118 mg/dL — ABNORMAL HIGH (ref 0–99)
NonHDL: 134.14
Total CHOL/HDL Ratio: 3
Triglycerides: 80 mg/dL (ref 0.0–149.0)
VLDL: 16 mg/dL (ref 0.0–40.0)

## 2015-10-27 LAB — H. PYLORI ANTIBODY, IGG: H PYLORI IGG: NEGATIVE

## 2015-10-27 LAB — HEPATIC FUNCTION PANEL
ALBUMIN: 4 g/dL (ref 3.5–5.2)
ALK PHOS: 69 U/L (ref 39–117)
ALT: 37 U/L — AB (ref 0–35)
AST: 25 U/L (ref 0–37)
BILIRUBIN DIRECT: 0.1 mg/dL (ref 0.0–0.3)
TOTAL PROTEIN: 7.1 g/dL (ref 6.0–8.3)
Total Bilirubin: 0.5 mg/dL (ref 0.2–1.2)

## 2015-10-27 LAB — TSH: TSH: 0.11 u[IU]/mL — ABNORMAL LOW (ref 0.35–4.50)

## 2015-10-27 LAB — BASIC METABOLIC PANEL
BUN: 9 mg/dL (ref 6–23)
CALCIUM: 9.8 mg/dL (ref 8.4–10.5)
CO2: 26 meq/L (ref 19–32)
Chloride: 106 mEq/L (ref 96–112)
Creatinine, Ser: 0.57 mg/dL (ref 0.40–1.20)
GFR: 141.92 mL/min (ref >60.00)
Glucose, Bld: 84 mg/dL (ref 70–99)
Potassium: 4.2 mEq/L (ref 3.5–5.1)
SODIUM: 140 meq/L (ref 135–145)

## 2015-10-27 LAB — VITAMIN D 25 HYDROXY (VIT D DEFICIENCY, FRACTURES): VITD: 28.62 ng/mL — ABNORMAL LOW (ref 30.00–100.00)

## 2015-10-27 MED ORDER — PANTOPRAZOLE SODIUM 40 MG PO TBEC: 40.0000 mg | DELAYED_RELEASE_TABLET | Freq: Every day | ORAL | Status: DC

## 2015-10-27 NOTE — Progress Notes (Signed)
   Subjective:    Patient ID: Joanne Li, female    DOB: 03/17/1961, 54 y.o.   MRN: [id number]  HPI CPE- UTD on colonoscopy, mammo, pap.   Review of Systems Patient reports no vision/ hearing changes, adenopathy,fever, weight change,  persistant/recurrent hoarseness , swallowing issues, chest pain, palpitations, edema, persistant/recurrent cough, hemoptysis, dyspnea (rest/exertional/paroxysmal nocturnal), gastrointestinal bleeding (melena, rectal bleeding), abdominal pain, bowel changes, GU symptoms (dysuria, hematuria, incontinence), Gyn symptoms (abnormal  bleeding, pain),  syncope, focal weakness, memory loss, numbness & tingling, hair/nail changes, abnormal bruising or bleeding, anxiety, or depression.   Severe acid reflux- 'sit straight up in bed' + hives    Objective:   Physical Exam General Appearance:    Alert, cooperative, no distress, appears stated age  Head:    Normocephalic, without obvious abnormality, atraumatic  Eyes:    PERRL, conjunctiva/corneas clear, EOM's intact, fundi    benign, both eyes  Ears:    Normal TM's and external ear canals, both ears  Nose:   Nares normal, septum midline, mucosa normal, no drainage    or sinus tenderness  Throat:   Lips, mucosa, and tongue normal; teeth and gums normal  Neck:   Supple, symmetrical, trachea midline, no adenopathy;    Thyroid: no enlargement/tenderness/nodules  Back:     Symmetric, no curvature, ROM normal, no CVA tenderness  Lungs:     Clear to auscultation bilaterally, respirations unlabored  Chest Wall:    No tenderness or deformity   Heart:    Regular rate and rhythm, S1 and S2 normal, no murmur, rub   or gallop  Breast Exam:    Deferred to GYN  Abdomen:     Soft, non-tender, bowel sounds active all four quadrants,    no masses, no organomegaly  Genitalia:    Deferred to GYN  Rectal:    Extremities:   Extremities normal, atraumatic, no cyanosis or edema  Pulses:   2+ and symmetric all extremities  Skin:    Skin color, texture, turgor normal, no rashes or lesions  Lymph nodes:   Cervical, supraclavicular, and axillary nodes normal  Neurologic:   CNII-XII intact, normal strength, sensation and reflexes    throughout          Assessment & Plan:

## 2015-10-27 NOTE — Telephone Encounter (Signed)
Left message for patient to call back for Pre-Vist update

## 2015-10-27 NOTE — Assessment & Plan Note (Signed)
Chronic problem.  Pt has seen allergy and they state that she is not allergic to anything despite frequent hives.  Pt may be having systemic response to H pylori.  Check labs and treat as needed.

## 2015-10-27 NOTE — Patient Instructions (Signed)
Follow up in 6 months to recheck cholesterol We'll notify you of your lab results and make any changes if needed Continue to work on healthy diet and regular exercise STOP the Tagamet START the Protonix daily!!! Call with any questions or concerns If you want to join [place] at the new [place] office, any scheduled appointments will automatically transfer and we will see you at [address] [place] [address] [place], [place] [address]  ([place] 12/14/14) [place] in there!!!

## 2015-10-27 NOTE — Assessment & Plan Note (Signed)
Deteriorated.  Pt is not currently on PPI- only taking Tagamet.  sxs are severe.  Start daily PPI.  Reviewed dietary and lifestyle modifications.  Due to severity of sxs, will check H pylori and tx prn.  Pt expressed understanding and is in agreement w/ plan.

## 2015-10-27 NOTE — Assessment & Plan Note (Signed)
PE WNL w/ exception of obesity.  UTD on pap, mammo, colonoscopy.  Check labs.  Encouraged healthy diet and regular exercise.  Anticipatory guidance provided.

## 2015-10-27 NOTE — Progress Notes (Signed)
Pre visit review using our clinic review tool, if applicable. No additional management support is needed unless otherwise documented below in the visit note. 

## 2015-10-27 NOTE — Telephone Encounter (Signed)
Pre Visit call completed. 

## 2015-10-28 ENCOUNTER — Other Ambulatory Visit (INDEPENDENT_AMBULATORY_CARE_PROVIDER_SITE_OTHER): Payer: Managed Care, Other (non HMO)

## 2015-10-28 ENCOUNTER — Other Ambulatory Visit: Payer: Self-pay | Admitting: Family Medicine

## 2015-10-28 DIAGNOSIS — E0590 Thyrotoxicosis, unspecified without thyrotoxic crisis or storm: Secondary | ICD-10-CM

## 2015-10-28 DIAGNOSIS — R7989 Other specified abnormal findings of blood chemistry: Secondary | ICD-10-CM

## 2015-10-28 DIAGNOSIS — R790 Abnormal level of blood mineral: Secondary | ICD-10-CM

## 2015-10-28 LAB — T4, FREE: FREE T4: 1.75 ng/dL — AB (ref 0.60–1.60)

## 2015-10-28 LAB — GLIADIN ANTIBODIES, SERUM
GLIADIN IGA: 4 U (ref <20)
Gliadin IgG: 2 Units (ref <20)

## 2015-10-28 LAB — TISSUE TRANSGLUTAMINASE, IGA: TISSUE TRANSGLUTAMINASE AB, IGA: 1 U/mL (ref <4)

## 2015-10-28 LAB — T3, FREE: T3, Free: 5.4 pg/mL — ABNORMAL HIGH (ref 2.3–4.2)

## 2015-10-30 LAB — RETICULIN ANTIBODIES, IGA W TITER: Reticulin Ab, IgA: NEGATIVE

## 2015-11-27 ENCOUNTER — Ambulatory Visit (INDEPENDENT_AMBULATORY_CARE_PROVIDER_SITE_OTHER): Payer: Managed Care, Other (non HMO) | Admitting: Internal Medicine

## 2015-11-27 ENCOUNTER — Encounter: Payer: Self-pay | Admitting: Internal Medicine

## 2015-11-27 VITALS — BP 114/62 | HR 83 | Temp 98.0°F | Resp 12 | Wt 206.4 lb

## 2015-11-27 DIAGNOSIS — E0590 Thyrotoxicosis, unspecified without thyrotoxic crisis or storm: Secondary | ICD-10-CM | POA: Diagnosis not present

## 2015-11-27 DIAGNOSIS — E0500 Thyrotoxicosis with diffuse goiter without thyrotoxic crisis or storm: Secondary | ICD-10-CM | POA: Insufficient documentation

## 2015-11-27 LAB — T3, FREE: T3, Free: 5.1 pg/mL — ABNORMAL HIGH (ref 2.3–4.2)

## 2015-11-27 LAB — T4, FREE: Free T4: 1.61 ng/dL — ABNORMAL HIGH (ref 0.60–1.60)

## 2015-11-27 LAB — TSH: TSH: 0.16 u[IU]/mL — AB (ref 0.35–4.50)

## 2015-11-27 NOTE — Progress Notes (Addendum)
Patient ID: Joanne Li, female   DOB: Jul 16, 1961, 54 y.o.   MRN: [id number]   HPI  Joanne Li is a 54 y.o.-year-old female, referred by her PCP, Dr. Birdie Riddle, for evaluation for thyrotoxicosis.  She has been investigated for hives that she has for 1.5 years (started after going for white water rafting). During this investigation, TFTs found abnormal.  I reviewed pt's thyroid tests: Lab Results  Component Value Date   TSH 0.11* 10/27/2015   TSH 1.31 12/04/2013   TSH 1.23 08/21/2012   TSH 1.25 07/25/2011   TSH 1.90 07/23/2010   TSH 1.42 07/22/2009   FREET4 1.75* 10/28/2015    Pt denies feeling nodules in neck, hoarseness, + occasionally dysphagia (needs to take small swallows and chew food well) /no odynophagia, SOB with lying down; she c/o: - no fatigue - no excessive sweating/heat intolerance - no tremors - + anxiety (panic attacks: throat closing) - + palpitations - no hyperdefecation - + intentional weight loss (stopped eating bread) - + hair loss   Pt does have a FH of thyroid ds. (sister - hypothyroidism. No FH of thyroid cancer. No h/o radiation tx to head or neck.  No seaweed or kelp, no recent contrast studies. No steroid use. No herbal supplements. No Biotin use.  I reviewed her chart and she also has a history of shoulder pain. Also GERD.  ROS: Constitutional: + see HPI, + poor sleep Eyes: no blurry vision, no xerophthalmia ENT: no sore throat, see HPI Cardiovascular: no CP/SOB/+ palpitations/no leg swelling Respiratory: no cough/SOB Gastrointestinal: no N/V/D/+ C/+ acid reflux Musculoskeletal: no muscle/+ joint aches Skin: + rash, + hair loss, + itching (hives) Neurological: no tremors/numbness/tingling/dizziness Psychiatric: no depression/+ anxiety  Past Medical History  Diagnosis Date  . MVP (mitral valve prolapse)   . Insomnia   . Sleep walking   . Hyperlipidemia    Past Surgical History  Procedure Laterality Date  . Shoulder surgery  2009   right   Social History   Social History  . Marital Status: Married    Spouse Name: N/A  . Number of Children: 1  . Years of Education: N/A   Occupational History  . food Therapist, nutritional    Social History Main Topics  . Smoking status: Never Smoker   . Smokeless tobacco: Never Used  . Alcohol Use: Yes     Comment: occasionally  . Drug Use: No   Current Outpatient Prescriptions on File Prior to Visit  Medication Sig Dispense Refill  . ALPRAZolam (XANAX) 0.5 MG tablet Take 0.5 tablets (0.25 mg total) by mouth at bedtime as needed for anxiety. 90 tablet 0  . cimetidine (TAGAMET) 400 MG tablet Take 400 mg by mouth 2 (two) times daily.     . cyproheptadine (PERIACTIN) 4 MG tablet Take 8 mg by mouth at bedtime.    Marland Kitchen EPINEPHrine (EPIPEN) 0.3 mg/0.3 mL IJ SOAJ injection Inject 0.3 mg into the muscle once.    . hydrOXYzine (ATARAX/VISTARIL) 25 MG tablet Take 1 tablet (25 mg total) by mouth every 8 (eight) hours as needed for itching. 12 tablet 0  . meloxicam (MOBIC) 15 MG tablet Take 15 mg by mouth daily as needed for pain.    . montelukast (SINGULAIR) 10 MG tablet     . naproxen sodium (ANAPROX) 220 MG tablet Take 220 mg by mouth daily as needed (pain).    . pantoprazole (PROTONIX) 40 MG tablet Take 1 tablet (40 mg total) by mouth daily. 30 tablet 3  .  Probiotic Product (PROBIOTIC DAILY PO) Take by mouth.    . simvastatin (ZOCOR) 20 MG tablet Take 1 tablet (20 mg total) by mouth daily. 90 tablet 1   No current facility-administered medications on file prior to visit.   Allergies  Allergen Reactions  . Metronidazole     Upset stomach  . Nitrofurantoin     REACTION: sick to stomach   Family History  Problem Relation Age of Onset  . Arthritis Mother   . Arthritis Father   . Coronary artery disease Father     s/p quadruple bypass  . Colon cancer Neg Hx    PE: BP 114/62 mmHg  Pulse 83  Temp(Src) 98 F (36.7 C) (Oral)  Resp 12  Wt 206 lb 6.4 oz (93.622 kg)  SpO2 98% Body  mass index is 34.35 kg/(m^2). Wt Readings from Last 3 Encounters:  11/27/15 206 lb 6.4 oz (93.622 kg)  10/27/15 207 lb (93.895 kg)  08/19/15 213 lb (96.616 kg)   Constitutional: overweight, in NAD Eyes: PERRLA, EOMI, no exophthalmos, no lid lag, no stare ENT: moist mucous membranes, no thyromegaly, no thyroid bruits, no cervical lymphadenopathy Cardiovascular: RRR, No MRG Respiratory: CTA B Gastrointestinal: abdomen soft, NT, ND, BS+ Musculoskeletal: no deformities, strength intact in all 4 Skin: moist, warm, no rashes Neurological: no tremor with outstretched hands, DTR normal in all 4  ASSESSMENT: 1. Thyrotoxicosis  PLAN:  1. Patient with a recently found low TSH and high fT4, without clear thyrotoxic sxs: has weight loss but it is intentional, no heat intolerance, no hyperdefecation, has occasional palpitations, also has anxiety.  - she does not appear to have exogenous causes for the low TSH.  - We discussed that possible causes of thyrotoxicosis are:  Graves ds   Thyroiditis toxic multinodular goiter/ toxic adenoma (I cannot feel nodules at palpation of her thyroid). - I suggested that we check the TSH, fT3 and fT4 and also add thyroid stimulating antibodies to screen for Graves' disease.  - If the tests remain abnormal, we may need an uptake and scan to differentiate between the 3 above possible etiologies  - we discussed about possible modalities of treatment for the above conditions, to include methimazole use, radioactive iodine ablation or (last resort) surgery. - we might need to do thyroid ultrasound depending on the results of the uptake and scan (if a cold nodule is present) - I do not feel that we need to add beta blockers at this time, since she is not tachycardic or tremulous - RTC in 3 months, but likely sooner for repeat labs  Office Visit on 11/27/2015  Component Date Value Ref Range Status  . T3, Free 11/27/2015 5.1* 2.3 - 4.2 pg/mL Final  . Free T4 11/27/2015  1.61* 0.60 - 1.60 ng/dL Final  . TSH 11/27/2015 0.16* 0.35 - 4.50 uIU/mL Final  TSI pending.  TFTs improved >> would like to recheck them in 1 month and if still abnormal or worsening >> may need Uptake and scan. As free hormones are minimally elevated, we can just follow her for now.   Component     Latest Ref Rng 11/27/2015  TSI     <140 % baseline 190 (H)  TSI slightly high.

## 2015-11-27 NOTE — Patient Instructions (Signed)
Please stop at the lab.  Please return in 3 months.  Hyperthyroidism Hyperthyroidism is when the thyroid is too active (overactive). Your thyroid is a large gland that is located in your neck. The thyroid helps to control how your body uses food (metabolism). When your thyroid is overactive, it produces too much of a hormone called thyroxine.  CAUSES Causes of hyperthyroidism may include:  Graves disease. This is when your immune system attacks the thyroid gland. This is the most common cause.  Inflammation of the thyroid gland.  Tumor in the thyroid gland or somewhere else.  Excessive use of thyroid medicines, including:  Prescription thyroid supplement.  Herbal supplements that mimic thyroid hormones.  Solid or fluid-filled lumps within your thyroid gland (thyroid nodules).  Excessive ingestion of iodine. RISK FACTORS  Being female.  Having a family history of thyroid conditions. SIGNS AND SYMPTOMS Signs and symptoms of hyperthyroidism may include:  Nervousness.  Inability to tolerate heat.  Unexplained weight loss.  Diarrhea.  Change in the texture of hair or skin.  Heart skipping beats or making extra beats.  Rapid heart rate.  Loss of menstruation.  Shaky hands.  Fatigue.  Restlessness.  Increased appetite.  Sleep problems.  Enlarged thyroid gland or nodules. DIAGNOSIS  Diagnosis of hyperthyroidism may include:  Medical history and physical exam.  Blood tests.  Ultrasound tests. TREATMENT Treatment may include:  Medicines to control your thyroid.  Surgery to remove your thyroid.  Radiation therapy. HOME CARE INSTRUCTIONS   Take medicines only as directed by your health care provider.  Do not use any tobacco products, including cigarettes, chewing tobacco, or electronic cigarettes. If you need help quitting, ask your health care provider.  Do not exercise or do physical activity until your health care provider approves.  Keep all  follow-up appointments as directed by your health care provider. This is important. SEEK MEDICAL CARE IF:  Your symptoms do not get better with treatment.  You have fever.  You are taking thyroid replacement medicine and you:  Have depression.  Feel mentally and physically slow.  Have weight gain. SEEK IMMEDIATE MEDICAL CARE IF:   You have decreased alertness or a change in your awareness.  You have abdominal pain.  You feel dizzy.  You have a rapid heartbeat.  You have an irregular heartbeat.   This information is not intended to replace advice given to you by your health care provider. Make sure you discuss any questions you have with your health care provider.   Document Released: 11/28/2005 Document Revised: 12/19/2014 Document Reviewed: 04/15/2014 Elsevier Interactive Patient Education [employer].

## 2015-11-30 ENCOUNTER — Encounter: Payer: Self-pay | Admitting: Family Medicine

## 2015-11-30 DIAGNOSIS — G4733 Obstructive sleep apnea (adult) (pediatric): Secondary | ICD-10-CM

## 2015-11-30 NOTE — Telephone Encounter (Signed)
Referral placed.

## 2015-12-02 LAB — THYROID STIMULATING IMMUNOGLOBULIN: TSI: 190 % baseline — ABNORMAL HIGH (ref <140)

## 2015-12-31 ENCOUNTER — Encounter: Payer: Self-pay | Admitting: Family Medicine

## 2016-01-11 ENCOUNTER — Ambulatory Visit (INDEPENDENT_AMBULATORY_CARE_PROVIDER_SITE_OTHER): Payer: Managed Care, Other (non HMO) | Admitting: Pulmonary Disease

## 2016-01-11 ENCOUNTER — Encounter: Payer: Self-pay | Admitting: Pulmonary Disease

## 2016-01-11 VITALS — BP 128/70 | HR 78 | Ht 65.0 in | Wt 212.0 lb

## 2016-01-11 DIAGNOSIS — Z23 Encounter for immunization: Secondary | ICD-10-CM | POA: Diagnosis not present

## 2016-01-11 DIAGNOSIS — G4750 Parasomnia, unspecified: Secondary | ICD-10-CM | POA: Diagnosis not present

## 2016-01-11 DIAGNOSIS — G4733 Obstructive sleep apnea (adult) (pediatric): Secondary | ICD-10-CM | POA: Diagnosis not present

## 2016-01-11 NOTE — Patient Instructions (Signed)
Will arrange for sleep study Will call to arrange for follow up after sleep study reviewed 

## 2016-01-11 NOTE — Progress Notes (Signed)
   Subjective:    Patient ID: Joanne Li, female    DOB: 1961/12/06, 55 y.o.   MRN: [id number]  HPI    Review of Systems  Constitutional: Negative for fever and unexpected weight change.  HENT: Positive for dental problem, sneezing and trouble swallowing. Negative for congestion, ear pain, nosebleeds, postnasal drip, rhinorrhea, sinus pressure and sore throat.   Eyes: Negative for redness and itching.  Respiratory: Positive for cough and shortness of breath. Negative for chest tightness and wheezing.   Cardiovascular: Positive for palpitations and leg swelling.  Gastrointestinal: Negative for nausea and vomiting.       Acid Heartburn  Genitourinary: Negative for dysuria.  Musculoskeletal: Negative for joint swelling.  Skin: Negative for rash ( itching - chronic x 1 year).  Neurological: Negative for headaches.  Hematological: Does not bruise/bleed easily.  Psychiatric/Behavioral: Negative for dysphoric mood. The patient is nervous/anxious.        Objective:   Physical Exam        Assessment & Plan:

## 2016-01-11 NOTE — Progress Notes (Signed)
Past Medical History She  has a past medical history of MVP (mitral valve prolapse); Insomnia; Sleep walking; and Hyperlipidemia.  Past Surgical History She  has past surgical history that includes Shoulder surgery (2009).  Current Outpatient Prescriptions on File Prior to Visit  Medication Sig  . ALPRAZolam (XANAX) 0.5 MG tablet Take 0.5 tablets (0.25 mg total) by mouth at bedtime as needed for anxiety.  Marland Kitchen EPINEPHrine (EPIPEN) 0.3 mg/0.3 mL IJ SOAJ injection Inject 0.3 mg into the muscle once.  . hydrOXYzine (ATARAX/VISTARIL) 25 MG tablet Take 1 tablet (25 mg total) by mouth every 8 (eight) hours as needed for itching.  . pantoprazole (PROTONIX) 40 MG tablet Take 1 tablet (40 mg total) by mouth daily.  . naproxen sodium (ANAPROX) 220 MG tablet Take 220 mg by mouth daily as needed (pain). Reported on 01/11/2016  . Probiotic Product (PROBIOTIC DAILY PO) Take by mouth. Reported on 01/11/2016   No current facility-administered medications on file prior to visit.    Allergies  Allergen Reactions  . Metronidazole     Upset stomach  . Nitrofurantoin     REACTION: sick to stomach    Family History Her family history includes Arthritis in her father and mother; Coronary artery disease in her father. There is no history of Colon cancer.  Social History She  reports that she has never smoked. She has never used smokeless tobacco. She reports that she drinks alcohol. She reports that she does not use illicit drugs.  Review of systems Review of Systems  Constitutional: Negative for fever and unexpected weight change.  HENT: Positive for dental problem, sneezing and trouble swallowing. Negative for congestion, ear pain, nosebleeds, postnasal drip, rhinorrhea, sinus pressure and sore throat.   Eyes: Negative for redness and itching.  Respiratory: Positive for cough and shortness of breath. Negative for chest tightness and wheezing.   Cardiovascular: Positive for palpitations and leg swelling.   Gastrointestinal: Negative for nausea and vomiting.       Acid Heartburn  Genitourinary: Negative for dysuria.  Musculoskeletal: Negative for joint swelling.  Skin: Negative for rash ( itching - chronic x 1 year).  Neurological: Negative for headaches.  Hematological: Does not bruise/bleed easily.  Psychiatric/Behavioral: Negative for dysphoric mood. The patient is nervous/anxious.     Chief Complaint  Patient presents with  . Sleep Consult    Referred by Dr Birdie Riddle. Epworth Socre: 17    Tests:  Vital signs BP 128/70 mmHg  Pulse 78  Ht 5\' 5"  (1.651 m)  Wt 212 lb (96.163 kg)  BMI 35.28 kg/m2  SpO2 97%  History of Present Illness Joanne Li is a 55 y.o. female for evaluation of sleep problems.  She has noticed trouble with her sleep all her life.  She talks in her sleep, walks in her sleep, and cusses in her sleep.  She also gets vivid nightmares.  There is no pattern to her nightmares.  She has jumped out of bed during her nightmares, but denies any injuries.  These have been getting worse.  She went on a cruise with her sisters over the holidays.  They were worried about her snoring, and told her that she stops breathing while asleep.  She goes to sleep at 10 pm.  She falls asleep after 30 minutes.  She wakes up 3 to 5 times during the night.  She gets out of bed at 5 am to go to work, but wishes she could stay in bed for several more hours.  She  feels tired in the morning.  She denies morning headache.  She was using xanax to help sleep, but was worried this was causing a rash.  She drinks coffee until 11 am to help get her going in the morning.  She denies bruxism.  She used to get terrible leg symptoms years ago, but this hasn't been an issue recently.  She denies sleep hallucinations, sleep paralysis, or cataplexy.  The Epworth score is 17 out of 24.   Physical Exam:  General - No distress ENT - No sinus tenderness, no oral exudate, no LAN, no thyromegaly, TM  clear, pupils equal/reactive, MP 3, enlarged tongue Cardiac - s1s2 regular, no murmur, pulses symmetric Chest - No wheeze/rales/dullness, good air entry, normal respiratory excursion Back - No focal tenderness Abd - Soft, non-tender, no organomegaly, + bowel sounds Ext - No edema Neuro - Normal strength, cranial nerves intact Skin - No rashes Psych - Normal mood, and behavior  Discussion: She has snoring, sleep disruption, witnessed apnea, and daytime sleepiness.  She has hx of sleep walking, and sleep talking.  These could all be related to sleep apnea.  We discussed how sleep apnea can affect various health problems, including risks for hypertension, cardiovascular disease, and diabetes.  We also discussed how sleep disruption can increase risks for accidents, such as while driving.  Weight loss as a means of improving sleep apnea was also reviewed.  Additional treatment options discussed were CPAP therapy, oral appliance, and surgical intervention.   Assessment/plan:  Obstructive sleep apnea. Plan: - will arrange for in lab sleep study  NREM parasomnia with somnambulism and somniloquy. Plan: - will assess further during in lab sleep study    Patient Instructions  Will arrange for sleep study Will call to arrange for follow up after sleep study reviewed      Chesley Mires, M.D. Pager [phone]

## 2016-01-12 LAB — HM PAP SMEAR

## 2016-01-15 ENCOUNTER — Encounter: Payer: Self-pay | Admitting: General Practice

## 2016-02-04 ENCOUNTER — Ambulatory Visit (INDEPENDENT_AMBULATORY_CARE_PROVIDER_SITE_OTHER): Payer: Managed Care, Other (non HMO) | Admitting: Family Medicine

## 2016-02-04 ENCOUNTER — Other Ambulatory Visit: Payer: Self-pay | Admitting: Family Medicine

## 2016-02-04 ENCOUNTER — Encounter: Payer: Self-pay | Admitting: Family Medicine

## 2016-02-04 ENCOUNTER — Ambulatory Visit: Payer: Managed Care, Other (non HMO) | Admitting: Family Medicine

## 2016-02-04 VITALS — BP 120/70 | HR 80 | Temp 98.0°F | Ht 65.0 in | Wt 208.4 lb

## 2016-02-04 DIAGNOSIS — R197 Diarrhea, unspecified: Secondary | ICD-10-CM | POA: Insufficient documentation

## 2016-02-04 DIAGNOSIS — A09 Infectious gastroenteritis and colitis, unspecified: Secondary | ICD-10-CM

## 2016-02-04 LAB — BASIC METABOLIC PANEL
BUN: 11 mg/dL (ref 6–23)
CHLORIDE: 106 meq/L (ref 96–112)
CO2: 28 mEq/L (ref 19–32)
Calcium: 9.6 mg/dL (ref 8.4–10.5)
Creatinine, Ser: 0.63 mg/dL (ref 0.40–1.20)
GFR: 126.31 mL/min (ref >60.00)
GLUCOSE: 89 mg/dL (ref 70–99)
POTASSIUM: 4.5 meq/L (ref 3.5–5.1)
SODIUM: 139 meq/L (ref 135–145)

## 2016-02-04 LAB — CBC WITH DIFFERENTIAL/PLATELET
BASOS ABS: 0 10*3/uL (ref 0.0–0.1)
Basophils Relative: 0.6 % (ref 0.0–3.0)
EOS ABS: 0.1 10*3/uL (ref 0.0–0.7)
Eosinophils Relative: 2.9 % (ref 0.0–5.0)
HEMATOCRIT: 40.1 % (ref 36.0–46.0)
HEMOGLOBIN: 13.1 g/dL (ref 12.0–15.0)
LYMPHS PCT: 26.9 % (ref 12.0–46.0)
Lymphs Abs: 1.3 10*3/uL (ref 0.7–4.0)
MCHC: 32.7 g/dL (ref 30.0–36.0)
MCV: 79.4 fl (ref 78.0–100.0)
MONO ABS: 0.4 10*3/uL (ref 0.1–1.0)
Monocytes Relative: 8 % (ref 3.0–12.0)
Neutro Abs: 2.9 10*3/uL (ref 1.4–7.7)
Neutrophils Relative %: 61.6 % (ref 43.0–77.0)
Platelets: 250 10*3/uL (ref 150.0–400.0)
RBC: 5.06 Mil/uL (ref 3.87–5.11)
RDW: 16.1 % — AB (ref 11.5–15.5)
WBC: 4.7 10*3/uL (ref 4.0–10.5)

## 2016-02-04 NOTE — Progress Notes (Signed)
   Subjective:    Patient ID: Thornton Park, female    DOB: [birth year], 55 y.o.   MRN: [id number]  HPI Diarrhea- sxs started ~2 weeks ago.  Pt reports ~5 stools/day.  + mucous, blood streaks on outside of stool.  Stools will vary between ribbon like, loose, and watery.  + gas.  Foul smelling.  Denies abd pain or cramping.  No dietary changes.  Recent abx (Clinda) use for root canal.  Pt has weaned off gluten x8 months and reports that she will have hives w/ gluten consumption.  Taking iron every other day in hopes of constipation but no luck.  Has not tried Immodium.  No known sick contacts.   Review of Systems For ROS see HPI     Objective:   Physical Exam  Constitutional: She is oriented to person, place, and time. She appears well-developed and well-nourished. No distress.  HENT:  Head: Normocephalic and atraumatic.  MMM  Neck: Neck supple.  Cardiovascular: Normal rate, regular rhythm and intact distal pulses.   Pulmonary/Chest: Effort normal and breath sounds normal. No respiratory distress. She has no wheezes. She has no rales.  Abdominal: Soft. She exhibits no distension. There is no tenderness. There is no rebound.  Hyperactive BS  Lymphadenopathy:    She has no cervical adenopathy.  Neurological: She is alert and oriented to person, place, and time.  Skin: Skin is warm and dry.  Vitals reviewed.         Assessment & Plan:

## 2016-02-04 NOTE — Progress Notes (Signed)
Pre visit review using our clinic review tool, if applicable. No additional management support is needed unless otherwise documented below in the visit note. 

## 2016-02-04 NOTE — Patient Instructions (Signed)
Follow up as needed Make sure you are drinking plenty of fluids Hold off on Immodium since we are thinking this is C diff Make sure you are taking a probiotic daily We will start treatment as soon as we have a result This can be highly contagious so please wash hands and if possible, avoid using the same bathroom as family members BLEACH! Call with any questions or concerns Hang in there!!!

## 2016-02-04 NOTE — Assessment & Plan Note (Signed)
New.  Presumed C Diff w/ recent prolonged use of Clinda.  Check labs, stool studies.  Pt has intolerance to Flagyl- if C diff will need PO Vanc.  Reviewed supportive care and red flags that should prompt return.  Pt expressed understanding and is in agreement w/ plan.

## 2016-02-05 ENCOUNTER — Telehealth: Payer: Self-pay | Admitting: Emergency Medicine

## 2016-02-05 ENCOUNTER — Other Ambulatory Visit: Payer: Self-pay | Admitting: General Practice

## 2016-02-05 LAB — C. DIFFICILE GDH AND TOXIN A/B
C. DIFF TOXIN A/B: NOT DETECTED
C. difficile GDH: DETECTED — AB

## 2016-02-05 LAB — CLOSTRIDIUM DIFFICILE BY PCR: Toxigenic C. Difficile by PCR: DETECTED — CR

## 2016-02-05 MED ORDER — VANCOMYCIN HCL 125 MG PO CAPS: 125.0000 mg | ORAL_CAPSULE | Freq: Four times a day (QID) | ORAL | Status: AC

## 2016-02-05 NOTE — Telephone Encounter (Signed)
Gwyndolyn Saxon from [facility] called Critical Results for this patient, positive C-Diff......KMP

## 2016-02-05 NOTE — Telephone Encounter (Signed)
Noted, PCP aware

## 2016-02-08 LAB — STOOL CULTURE

## 2016-02-25 ENCOUNTER — Ambulatory Visit: Payer: Managed Care, Other (non HMO) | Admitting: Internal Medicine

## 2016-03-11 ENCOUNTER — Encounter (HOSPITAL_BASED_OUTPATIENT_CLINIC_OR_DEPARTMENT_OTHER): Payer: Managed Care, Other (non HMO)

## 2016-03-18 ENCOUNTER — Encounter (HOSPITAL_BASED_OUTPATIENT_CLINIC_OR_DEPARTMENT_OTHER): Payer: Managed Care, Other (non HMO)

## 2016-04-25 ENCOUNTER — Encounter: Payer: Self-pay | Admitting: Family Medicine

## 2016-04-25 ENCOUNTER — Ambulatory Visit (INDEPENDENT_AMBULATORY_CARE_PROVIDER_SITE_OTHER): Payer: Managed Care, Other (non HMO) | Admitting: Family Medicine

## 2016-04-25 VITALS — BP 124/82 | HR 64 | Temp 98.0°F | Resp 16 | Ht 65.0 in | Wt 213.0 lb

## 2016-04-25 DIAGNOSIS — E785 Hyperlipidemia, unspecified: Secondary | ICD-10-CM | POA: Diagnosis not present

## 2016-04-25 DIAGNOSIS — A09 Infectious gastroenteritis and colitis, unspecified: Secondary | ICD-10-CM | POA: Diagnosis not present

## 2016-04-25 DIAGNOSIS — R197 Diarrhea, unspecified: Secondary | ICD-10-CM

## 2016-04-25 LAB — BASIC METABOLIC PANEL
BUN: 11 mg/dL (ref 6–23)
CALCIUM: 9.2 mg/dL (ref 8.4–10.5)
CO2: 28 meq/L (ref 19–32)
CREATININE: 0.78 mg/dL (ref 0.40–1.20)
Chloride: 105 mEq/L (ref 96–112)
GFR: 98.64 mL/min (ref >60.00)
GLUCOSE: 103 mg/dL — AB (ref 70–99)
Potassium: 3.9 mEq/L (ref 3.5–5.1)
Sodium: 139 mEq/L (ref 135–145)

## 2016-04-25 LAB — LIPID PANEL
CHOL/HDL RATIO: 4
Cholesterol: 196 mg/dL (ref 0–200)
HDL: 48.7 mg/dL (ref >39.00)
LDL CALC: 130 mg/dL — AB (ref 0–99)
NONHDL: 147.47
Triglycerides: 85 mg/dL (ref 0.0–149.0)
VLDL: 17 mg/dL (ref 0.0–40.0)

## 2016-04-25 LAB — HEPATIC FUNCTION PANEL
ALBUMIN: 3.9 g/dL (ref 3.5–5.2)
ALK PHOS: 77 U/L (ref 39–117)
ALT: 16 U/L (ref 0–35)
AST: 13 U/L (ref 0–37)
BILIRUBIN DIRECT: 0 mg/dL (ref 0.0–0.3)
TOTAL PROTEIN: 6.6 g/dL (ref 6.0–8.3)
Total Bilirubin: 0.2 mg/dL (ref 0.2–1.2)

## 2016-04-25 MED ORDER — EPINEPHRINE 0.3 MG/0.3ML IJ SOAJ: 0.3000 mg | Freq: Once | INTRAMUSCULAR | Status: AC

## 2016-04-25 NOTE — Patient Instructions (Signed)
Schedule your complete physical in 6 months We'll notify you of your lab results and make any changes if needed Try and work on healthy diet and regular exercise- you can do it! Call with any questions or concerns Thanks for sticking with [place]! Have a great summer!!!

## 2016-04-25 NOTE — Progress Notes (Signed)
   Subjective:    Patient ID: Thornton Park, female    DOB: 08-28-1961, 55 y.o.   MRN: [id number]  HPI Hyperlipidemia- chronic problem. On fish oil.  Pt has gained 5 lbs since last visit.  Not exercising regularly.  Denies CP, SOB, HAs, visual changes, abd pain, N/V.  C diff- pt completed Vancomycin on 3/10.  Still having soft stools.  Pt is concerned that she may still have residual infection.  Denies frequency.  'maybe this is the new normal'.  Worse w/ fatty foods.   Review of Systems For ROS see HPI     Objective:   Physical Exam  Constitutional: She is oriented to person, place, and time. She appears well-developed and well-nourished. No distress.  HENT:  Head: Normocephalic and atraumatic.  Eyes: Conjunctivae and EOM are normal. Pupils are equal, round, and reactive to light.  Neck: Normal range of motion. Neck supple. No thyromegaly present.  Cardiovascular: Normal rate, regular rhythm, normal heart sounds and intact distal pulses.   No murmur heard. Pulmonary/Chest: Effort normal and breath sounds normal. No respiratory distress.  Abdominal: Soft. She exhibits no distension. There is no tenderness.  Musculoskeletal: She exhibits no edema.  Lymphadenopathy:    She has no cervical adenopathy.  Neurological: She is alert and oriented to person, place, and time.  Skin: Skin is warm and dry.  Psychiatric: She has a normal mood and affect. Her behavior is normal.  Vitals reviewed.         Assessment & Plan:

## 2016-04-25 NOTE — Progress Notes (Signed)
Pre visit review using our clinic review tool, if applicable. No additional management support is needed unless otherwise documented below in the visit note. 

## 2016-04-26 ENCOUNTER — Ambulatory Visit: Payer: Managed Care, Other (non HMO) | Admitting: Family Medicine

## 2016-04-26 NOTE — Assessment & Plan Note (Signed)
Treated for C diff.  Continues to have loose stools but no longer having frequency or urgency.  No place for test of cure in C Diff.  Discussed this w/ pt and will follow her sxs clinically.  Pt expressed understanding and is in agreement w/ plan.

## 2016-04-26 NOTE — Assessment & Plan Note (Signed)
Chronic problem.  Attempting to control w/ healthy diet and regular exercise.  Check labs.  Start meds prn. 

## 2016-07-07 ENCOUNTER — Other Ambulatory Visit: Payer: Self-pay | Admitting: Family Medicine

## 2016-07-07 DIAGNOSIS — Z1231 Encounter for screening mammogram for malignant neoplasm of breast: Secondary | ICD-10-CM

## 2016-07-13 ENCOUNTER — Ambulatory Visit
Admission: RE | Admit: 2016-07-13 | Discharge: 2016-07-13 | Disposition: A | Discharge status: Home / Self Care | Payer: Managed Care, Other (non HMO) | Source: Ambulatory Visit | Attending: Family Medicine | Admitting: Family Medicine

## 2016-07-13 DIAGNOSIS — Z1231 Encounter for screening mammogram for malignant neoplasm of breast: Secondary | ICD-10-CM

## 2016-09-09 ENCOUNTER — Ambulatory Visit (INDEPENDENT_AMBULATORY_CARE_PROVIDER_SITE_OTHER): Payer: Managed Care, Other (non HMO) | Admitting: Podiatry

## 2016-09-09 ENCOUNTER — Encounter: Payer: Self-pay | Admitting: Podiatry

## 2016-09-09 ENCOUNTER — Telehealth: Payer: Self-pay | Admitting: *Deleted

## 2016-09-09 ENCOUNTER — Ambulatory Visit (INDEPENDENT_AMBULATORY_CARE_PROVIDER_SITE_OTHER): Payer: Managed Care, Other (non HMO)

## 2016-09-09 VITALS — Resp 16 | Ht 65.0 in | Wt 209.0 lb

## 2016-09-09 DIAGNOSIS — L84 Corns and callosities: Secondary | ICD-10-CM | POA: Diagnosis not present

## 2016-09-09 DIAGNOSIS — M79672 Pain in left foot: Secondary | ICD-10-CM

## 2016-09-09 DIAGNOSIS — B351 Tinea unguium: Secondary | ICD-10-CM

## 2016-09-09 DIAGNOSIS — M722 Plantar fascial fibromatosis: Secondary | ICD-10-CM | POA: Diagnosis not present

## 2016-09-09 DIAGNOSIS — M79671 Pain in right foot: Secondary | ICD-10-CM | POA: Diagnosis not present

## 2016-09-09 MED ORDER — TRIAMCINOLONE ACETONIDE 10 MG/ML IJ SUSP: 10.0000 mg | Freq: Once | INTRAMUSCULAR | Status: DC

## 2016-09-09 NOTE — Progress Notes (Signed)
Subjective:     Patient ID: Joanne Li, female   DOB: [birth year], 55 y.o.   MRN: [id number]  HPI patient presents with pain in the plantar aspect of the heel region bilateral around the fifth metatarsal heads bilateral and also nail disease hallux bilateral with yellow discoloration. States the heels been hurting her for at least a year and the pain is gradually getting worse in her feet   Review of Systems  All other systems reviewed and are negative.      Objective:   Physical Exam  Constitutional: She is oriented to person, place, and time.  Cardiovascular: Intact distal pulses.   Musculoskeletal: Normal range of motion.  Neurological: She is oriented to person, place, and time.  Skin: Skin is warm.  Nursing note and vitals reviewed.  neurovascular status intact muscle strength adequate range of motion within normal limits with patient found to have exquisite discomfort plantar aspect heel region bilateral with fluid buildup and also noted to have lesions underneath the fifth metatarsals bilateral with keratotic tissue formation and noted to have his bilateral. Good digital perfusion and well oriented 3     Assessment:     Structural fasciitis bilateral with plantar flexed fifth metatarsals with lesion and mycotic hallux nail infection    Plan:     H&P x-rays reviewed and injected the plantar fascia bilateral 3 mg Kenalog 5 mill grams Xylocaine and applied fascial bracing bilateral. Debrided lesions on fifth metatarsals and placed on formula 3 to help with nail disease and reappoint to recheck  X-ray report indicates small spurs with no indication stress fracture arthritis

## 2016-09-09 NOTE — Telephone Encounter (Signed)
Pt states she thought Dr. Paulla Dolly was going to give her an antiinflammatory medication.  I reviewed today's clinicals and pt was given the steroid injections and I explain to pt that is the antiinflammatory medication Dr. Paulla Dolly had described. I also told pt if she would ice her feet 3-4 times daily with a cloth covered ice pack for 15-20 minutes each session that would help with the inflammation and pain. Pt states understanding.

## 2016-09-09 NOTE — Progress Notes (Signed)
   Subjective:    Patient ID: Joanne Li, female    DOB: [birth year], 55 y.o.   MRN: [id number]  HPI Chief Complaint  Patient presents with  . 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 Callouses    Bilateral; lateral side-below 5th toe  . Nail Problem    Bilateral; great toes; nail discoloration & thickened nail; pt staed, "nails are brittle; needs checked for nail fungus"      Review of Systems  HENT: Positive for sinus pressure.   Respiratory: Positive for cough.   All other systems reviewed and are negative.      Objective:   Physical Exam        Assessment & Plan:

## 2016-09-09 NOTE — Patient Instructions (Signed)

## 2016-09-22 ENCOUNTER — Ambulatory Visit (INDEPENDENT_AMBULATORY_CARE_PROVIDER_SITE_OTHER): Payer: Managed Care, Other (non HMO) | Admitting: Podiatry

## 2016-09-22 ENCOUNTER — Encounter: Payer: Self-pay | Admitting: Podiatry

## 2016-09-22 DIAGNOSIS — M722 Plantar fascial fibromatosis: Secondary | ICD-10-CM

## 2016-09-22 NOTE — Progress Notes (Signed)
Subjective:     Patient ID: Thornton Park, female   DOB: [birth year], 55 y.o.   MRN: [id number]  HPI patient states that her feet are feeling significantly better with minimal discomfort   Review of Systems     Objective:   Physical Exam Neurovascular status intact muscle strength adequate significant diminishment of discomfort in the plantar fascia bilateral and fifth metatarsal with mild keratotic lesion fifth right    Assessment:     Doing well after treatment for both plantar fascia and lesion formation    Plan:     Spent a great of time going over all physical therapy anti-inflammatories and shoe gear modifications. Patient be seen back as needed

## 2016-09-23 ENCOUNTER — Encounter: Payer: Self-pay | Admitting: Family Medicine

## 2016-09-23 ENCOUNTER — Ambulatory Visit (INDEPENDENT_AMBULATORY_CARE_PROVIDER_SITE_OTHER): Payer: Managed Care, Other (non HMO) | Admitting: Family Medicine

## 2016-09-23 VITALS — BP 108/78 | HR 80 | Temp 98.0°F | Resp 16 | Ht 65.0 in | Wt 206.1 lb

## 2016-09-23 DIAGNOSIS — R290 Tetany: Secondary | ICD-10-CM

## 2016-09-23 LAB — TSH: TSH: 0.01 m[IU]/L — AB

## 2016-09-23 LAB — CBC WITH DIFFERENTIAL/PLATELET
BASOS PCT: 0 %
Basophils Absolute: 0 cells/uL (ref 0–200)
EOS PCT: 2 %
Eosinophils Absolute: 110 cells/uL (ref 15–500)
HCT: 40.1 % (ref 35.0–45.0)
HEMOGLOBIN: 13.3 g/dL (ref 11.7–15.5)
LYMPHS ABS: 1650 {cells}/uL (ref 850–3900)
Lymphocytes Relative: 30 %
MCH: 27.3 pg (ref 27.0–33.0)
MCHC: 33.2 g/dL (ref 32.0–36.0)
MCV: 82.2 fL (ref 80.0–100.0)
MPV: 11.3 fL (ref 7.5–12.5)
Monocytes Absolute: 440 cells/uL (ref 200–950)
Monocytes Relative: 8 %
NEUTROS ABS: 3300 {cells}/uL (ref 1500–7800)
Neutrophils Relative %: 60 %
Platelets: 252 10*3/uL (ref 140–400)
RBC: 4.88 MIL/uL (ref 3.80–5.10)
RDW: 14.9 % (ref 11.0–15.0)
WBC: 5.5 10*3/uL (ref 3.8–10.8)

## 2016-09-23 LAB — BASIC METABOLIC PANEL
BUN: 16 mg/dL (ref 7–25)
CHLORIDE: 103 mmol/L (ref 98–110)
CO2: 28 mmol/L (ref 20–31)
Calcium: 9.6 mg/dL (ref 8.6–10.4)
Creat: 0.77 mg/dL (ref 0.50–1.05)
Glucose, Bld: 82 mg/dL (ref 65–99)
POTASSIUM: 4.6 mmol/L (ref 3.5–5.3)
SODIUM: 140 mmol/L (ref 135–146)

## 2016-09-23 LAB — MAGNESIUM: MAGNESIUM: 2 mg/dL (ref 1.5–2.5)

## 2016-09-23 NOTE — Progress Notes (Signed)
Pre visit review using our clinic review tool, if applicable. No additional management support is needed unless otherwise documented below in the visit note. 

## 2016-09-23 NOTE — Patient Instructions (Signed)
Follow up as needed/scheduled We'll notify you of your lab results and make any changes if needed Increase your water intake!!!! Call with any questions or concerns Hang in there!  We'll try and figure this out!!!

## 2016-09-23 NOTE — Progress Notes (Signed)
   Subjective:    Patient ID: Joanne Li, female    DOB: September 18, 1961, 55 y.o.   MRN: [id number]  HPI Cramps- pt reports 'foot cramps, turf toe, and shin cramps'.  Pt has seen Dr Paulla Dolly for this.  This afternoon while cutting ribs, she developed severe spasm in L hand- pt is L hand dominant.  Pt on Saturday had her 'whole foot draw up'.  Pt takes Mg supplement.  Not sure if she is drinking enough water.  Pt reports drinking milk regularly.  No new medications.  Review of Systems For ROS see HPI     Objective:   Physical Exam  Constitutional: She is oriented to person, place, and time. She appears well-developed and well-nourished. No distress.  HENT:  Head: Normocephalic and atraumatic.  Cardiovascular: Intact distal pulses.   Neurological: She is alert and oriented to person, place, and time. She has normal reflexes. Coordination normal.  Skin: Skin is warm and dry. No erythema.  Psychiatric: She has a normal mood and affect. Her behavior is normal. Thought content normal.  Vitals reviewed.         Assessment & Plan:  Carpopedal spasm- pt admits to poor fluid intake recently.  This may be cause of her cramping/spasm.  Check labs to r/o other causes such as hypocalcemia, low magnesium, abnormal thyroid, anemia.  Encouraged increased water intake.  Will follow closely.

## 2016-09-26 LAB — PTH, INTACT AND CALCIUM
CALCIUM: 9.6 mg/dL (ref 8.6–10.4)
PTH: 33 pg/mL (ref 14–64)

## 2016-09-27 NOTE — Progress Notes (Signed)
Called pt and lmovm to return call.

## 2016-09-28 ENCOUNTER — Other Ambulatory Visit: Payer: Self-pay | Admitting: Family Medicine

## 2016-09-28 DIAGNOSIS — R7989 Other specified abnormal findings of blood chemistry: Secondary | ICD-10-CM

## 2016-09-29 ENCOUNTER — Encounter: Payer: Self-pay | Admitting: Family Medicine

## 2016-11-01 ENCOUNTER — Ambulatory Visit (INDEPENDENT_AMBULATORY_CARE_PROVIDER_SITE_OTHER): Payer: Managed Care, Other (non HMO) | Admitting: Family Medicine

## 2016-11-01 ENCOUNTER — Encounter: Payer: Self-pay | Admitting: Family Medicine

## 2016-11-01 VITALS — BP 110/68 | HR 78 | Temp 98.1°F | Resp 16 | Ht 65.0 in | Wt 203.1 lb

## 2016-11-01 DIAGNOSIS — Z0000 Encounter for general adult medical examination without abnormal findings: Secondary | ICD-10-CM | POA: Diagnosis not present

## 2016-11-01 LAB — BASIC METABOLIC PANEL
BUN: 9 mg/dL (ref 6–23)
CALCIUM: 9.7 mg/dL (ref 8.4–10.5)
CO2: 29 mEq/L (ref 19–32)
Chloride: 107 mEq/L (ref 96–112)
Creatinine, Ser: 0.59 mg/dL (ref 0.40–1.20)
GFR: 135.87 mL/min (ref >60.00)
Glucose, Bld: 89 mg/dL (ref 70–99)
POTASSIUM: 4.6 meq/L (ref 3.5–5.1)
SODIUM: 141 meq/L (ref 135–145)

## 2016-11-01 LAB — HEPATIC FUNCTION PANEL
ALBUMIN: 4 g/dL (ref 3.5–5.2)
ALK PHOS: 70 U/L (ref 39–117)
ALT: 18 U/L (ref 0–35)
AST: 14 U/L (ref 0–37)
Bilirubin, Direct: 0.1 mg/dL (ref 0.0–0.3)
TOTAL PROTEIN: 6.6 g/dL (ref 6.0–8.3)
Total Bilirubin: 0.5 mg/dL (ref 0.2–1.2)

## 2016-11-01 LAB — CBC WITH DIFFERENTIAL/PLATELET
BASOS ABS: 0 10*3/uL (ref 0.0–0.1)
Basophils Relative: 0.6 % (ref 0.0–3.0)
EOS ABS: 0.1 10*3/uL (ref 0.0–0.7)
Eosinophils Relative: 2.4 % (ref 0.0–5.0)
HCT: 41.5 % (ref 36.0–46.0)
Hemoglobin: 13.5 g/dL (ref 12.0–15.0)
LYMPHS ABS: 1.3 10*3/uL (ref 0.7–4.0)
Lymphocytes Relative: 34.4 % (ref 12.0–46.0)
MCHC: 32.6 g/dL (ref 30.0–36.0)
MCV: 82.3 fl (ref 78.0–100.0)
MONOS PCT: 9.4 % (ref 3.0–12.0)
Monocytes Absolute: 0.4 10*3/uL (ref 0.1–1.0)
NEUTROS PCT: 53.2 % (ref 43.0–77.0)
Neutro Abs: 2.1 10*3/uL (ref 1.4–7.7)
Platelets: 213 10*3/uL (ref 150.0–400.0)
RBC: 5.05 Mil/uL (ref 3.87–5.11)
RDW: 15.3 % (ref 11.5–15.5)
WBC: 3.9 10*3/uL — ABNORMAL LOW (ref 4.0–10.5)

## 2016-11-01 LAB — LIPID PANEL
CHOLESTEROL: 198 mg/dL (ref 0–200)
HDL: 67.7 mg/dL (ref >39.00)
LDL Cholesterol: 115 mg/dL — ABNORMAL HIGH (ref 0–99)
NonHDL: 130.62
TRIGLYCERIDES: 78 mg/dL (ref 0.0–149.0)
Total CHOL/HDL Ratio: 3
VLDL: 15.6 mg/dL (ref 0.0–40.0)

## 2016-11-01 LAB — VITAMIN D 25 HYDROXY (VIT D DEFICIENCY, FRACTURES): VITD: 37.03 ng/mL (ref 30.00–100.00)

## 2016-11-01 LAB — TSH: TSH: 0.02 u[IU]/mL — AB (ref 0.35–4.50)

## 2016-11-01 NOTE — Patient Instructions (Signed)
Follow up in 1 year or as needed We'll notify you of your lab results and make any changes if needed Continue to work on healthy diet and regular exercise- you look great! Call with any questions or concerns Happy Holidays!!! 

## 2016-11-01 NOTE — Progress Notes (Signed)
Pre visit review using our clinic review tool, if applicable. No additional management support is needed unless otherwise documented below in the visit note. 

## 2016-11-01 NOTE — Progress Notes (Signed)
   Subjective:    Patient ID: Joanne Li, female    DOB: [birth year], 55 y.o.   MRN: [id number]  HPI CPE- UTD on GYN, colonoscopy, Tdap.     Review of Systems Patient reports no vision/ hearing changes, adenopathy,fever, weight change,  persistant/recurrent hoarseness , swallowing issues, chest pain, palpitations, edema, persistant/recurrent cough, hemoptysis, dyspnea (rest/exertional/paroxysmal nocturnal), gastrointestinal bleeding (melena, rectal bleeding), abdominal pain, significant heartburn, bowel changes, GU symptoms (dysuria, hematuria, incontinence), Gyn symptoms (abnormal  bleeding, pain),  syncope, focal weakness, memory loss, numbness & tingling, skin/hair/nail changes, abnormal bruising or bleeding, anxiety, or depression.     Objective:   Physical Exam General Appearance:    Alert, cooperative, no distress, appears stated age  Head:    Normocephalic, without obvious abnormality, atraumatic  Eyes:    PERRL, conjunctiva/corneas clear, EOM's intact, fundi    benign, both eyes  Ears:    Normal TM's and external ear canals, both ears  Nose:   Nares normal, septum midline, mucosa normal, no drainage    or sinus tenderness  Throat:   Lips, mucosa, and tongue normal; teeth and gums normal  Neck:   Supple, symmetrical, trachea midline, no adenopathy;    Thyroid: no enlargement/tenderness/nodules  Back:     Symmetric, no curvature, ROM normal, no CVA tenderness  Lungs:     Clear to auscultation bilaterally, respirations unlabored  Chest Wall:    No tenderness or deformity   Heart:    Regular rate and rhythm, S1 and S2 normal, no murmur, rub   or gallop  Breast Exam:    Deferred to GYN  Abdomen:     Soft, non-tender, bowel sounds active all four quadrants,    no masses, no organomegaly  Genitalia:    Deferred to GYN  Rectal:    Extremities:   Extremities normal, atraumatic, no cyanosis or edema  Pulses:   2+ and symmetric all extremities  Skin:   Skin color, texture, turgor  normal, no rashes or lesions  Lymph nodes:   Cervical, supraclavicular, and axillary nodes normal  Neurologic:   CNII-XII intact, normal strength, sensation and reflexes    throughout          Assessment & Plan:

## 2016-11-01 NOTE — Assessment & Plan Note (Signed)
Pt's PE WNL.  UTD on GYN, colonoscopy, Tdap.  Check labs.  Anticipatory guidance provided.  

## 2016-11-24 ENCOUNTER — Encounter: Payer: Self-pay | Admitting: Internal Medicine

## 2016-11-24 ENCOUNTER — Ambulatory Visit (INDEPENDENT_AMBULATORY_CARE_PROVIDER_SITE_OTHER): Payer: Managed Care, Other (non HMO) | Admitting: Internal Medicine

## 2016-11-24 VITALS — BP 116/74 | HR 82 | Ht 65.0 in | Wt 207.0 lb

## 2016-11-24 DIAGNOSIS — E0590 Thyrotoxicosis, unspecified without thyrotoxic crisis or storm: Secondary | ICD-10-CM

## 2016-11-24 NOTE — Progress Notes (Signed)
Patient ID: Joanne Li, female   DOB: Aug 27, 1961, 55 y.o.   MRN: [id number]   HPI  Joanne Li is a 55 y.o.-year-old female,returning for f/u for thyrotoxicosis. Last visit 1 year ago.  At last visit, her TFTs were improving >> we decided to only follow her. Since then, though, TSH has decreased to <0.1. Free T4 is also high.  I reviewed pt's thyroid tests: Lab Results  Component Value Date   TSH 0.02 (L) 11/01/2016   TSH 0.01 (L) 09/23/2016   TSH 0.16 (L) 11/27/2015   TSH 0.11 (L) 10/27/2015   TSH 1.31 12/04/2013   TSH 1.23 08/21/2012   TSH 1.25 07/25/2011   TSH 1.90 07/23/2010   TSH 1.42 07/22/2009   FREET4 1.61 (H) 11/27/2015   FREET4 1.75 (H) 10/28/2015    Component     Latest Ref Rng 11/27/2015  TSI     <140 % baseline 190 (H)  TSI slightly high.  Pt denies feeling nodules in neck, hoarseness, no dysphagia/no odynophagia, SOB with lying down; she c/o: - mm cramps - no fatigue - no excessive sweating/heat intolerance, but has occasional hot flushes - no tremors - + anxiety - not new - + occasional palpitations - no hyperdefecation - no weight loss - no hair loss   Pt does have a FH of thyroid ds. (sister - hypothyroidism). No FH of thyroid cancer. No h/o radiation tx to head or neck.  No seaweed or kelp, no recent contrast studies. No steroid use. No herbal supplements. No Biotin use.  I reviewed her chart and she also has a history of shoulder pain. Also GERD.  ROS: Constitutional: + see HPI Eyes: no blurry vision, no xerophthalmia ENT: no sore throat, see HPI Cardiovascular: no CP/SOB/+ palpitations/no leg swelling Respiratory: no cough/SOB Gastrointestinal: no N/V/D/+ C/+ acid reflux Musculoskeletal: no muscle/ joint aches Skin: no rash, + hair loss Neurological: no tremors/numbness/tingling/dizziness  I reviewed pt's medications, allergies, PMH, social hx, family hx, and changes were documented in the history of present illness. Otherwise,  unchanged from my initial visit note.  Past Medical History:  Diagnosis Date  . Hyperlipidemia   . Insomnia   . MVP (mitral valve prolapse)   . Sleep walking    Past Surgical History:  Procedure Laterality Date  . SHOULDER SURGERY  2009   right   Social History   Social History  . Marital Status: Married    Spouse Name: N/A  . Number of Children: 1   Occupational History  . food Therapist, nutritional    Social History Main Topics  . Smoking status: Never Smoker   . Smokeless tobacco: Never Used  . Alcohol Use: Yes     Comment: occasionally  . Drug Use: No   Current Outpatient Prescriptions on File Prior to Visit  Medication Sig Dispense Refill  . ALPRAZolam (XANAX) 0.5 MG tablet Take 0.5 tablets (0.25 mg total) by mouth at bedtime as needed for anxiety. 90 tablet 0  . EPINEPHrine 0.3 mg/0.3 mL IJ SOAJ injection Inject 0.3 mLs (0.3 mg total) into the muscle once. Reported on 04/25/2016 (Patient not taking: Reported on 11/01/2016) 1 Device 3  . Omega-3 Fatty Acids (FISH OIL) 1000 MG CAPS Take 1,000 mg by mouth daily.    . Probiotic Product (PROBIOTIC DAILY PO) Take by mouth. Reported on 01/11/2016     No current facility-administered medications on file prior to visit.    Allergies  Allergen Reactions  . Metronidazole     Upset  stomach  . Nitrofurantoin     REACTION: sick to stomach  . Wheat Bran Hives    On Body, Face and soles of feet   Family History  Problem Relation Age of Onset  . Arthritis Mother   . Arthritis Father   . Coronary artery disease Father     s/p quadruple bypass  . Colon cancer Neg Hx    PE: BP 116/74 (BP Location: Right Arm, Patient Position: Sitting, Cuff Size: Large)   Pulse 82   Ht 5\' 5"  (1.651 m)   Wt 207 lb (93.9 kg)   SpO2 98%   BMI 34.45 kg/m  Body mass index is 34.45 kg/m. Wt Readings from Last 3 Encounters:  11/24/16 207 lb (93.9 kg)  11/01/16 203 lb 2 oz (92.1 kg)  09/23/16 206 lb 2 oz (93.5 kg)   Constitutional: overweight,  in NAD Eyes: PERRLA, EOMI, no exophthalmos, no lid lag, no stare ENT: moist mucous membranes, no thyromegaly, no thyroid bruits, no cervical lymphadenopathy Cardiovascular: RRR, No MRG Respiratory: CTA B Gastrointestinal: abdomen soft, NT, ND, BS+ Musculoskeletal: no deformities, strength intact in all 4 Skin: moist, warm, no rashes Neurological: no tremor with outstretched hands, DTR normal in all 4  ASSESSMENT: 1. Thyrotoxicosis  PLAN:  1. Patient with a history of mildly low TSH and high fT4, now more pronounced; without clear thyrotoxic sxs: She has hot flashes after menopause, no hyperdefecation, no weight loss, does have  occasional palpitations, also has anxiety - not new.  - she does not appear to have exogenous causes for the low TSH.  - We again discussed that possible causes of thyrotoxicosis are:  Graves ds  (we check TSI's at last visit and they were mildly elevated, at 190%) Thyroiditis (I doubt, due to long disease duration) toxic multinodular goiter/ toxic adenoma (I cannot feel nodules at palpation of her thyroid). - She recently had TFTs, I will not repeat them today, but will order a thyroid uptake and scan to differentiate between the 3 above possible etiologies  - we discussed about possible modalities of treatment for the above conditions, to include methimazole use, radioactive iodine ablation or (last resort) surgery. We discussed about possible side effects of methimazole in case we need to start this. - we might need to do thyroid ultrasound depending on the results of the uptake and scan (if a cold nodule is present) - I do not feel that we need to add beta blockers at this time, since she is not tachycardic or tremulous - RTC in 3 months, but likely sooner for repeat labs, in 1.5 months  Orders Placed This Encounter  Procedures  . NM THYROID SNG UPTAKE W/IMAGING  . T4, free  . T3, free  . TSH   Philemon Kingdom, MD PhD [facility]

## 2016-11-24 NOTE — Patient Instructions (Addendum)
You will be called with the thyroid uptake and scan schedule.  We may need to start Methimazole, but I will let you know after the results are back.  Please stop the Methimazole (Tapazole) and call us or your primary care doctor if you develop: - sore throat - fever - yellow skin - dark urine - light colored stools As we will then need to check your blood counts and liver tests.  Please come back for a follow-up appointment in 3 months.

## 2016-12-07 ENCOUNTER — Encounter (HOSPITAL_COMMUNITY): Payer: Managed Care, Other (non HMO)

## 2016-12-08 ENCOUNTER — Encounter (HOSPITAL_COMMUNITY): Payer: Managed Care, Other (non HMO)

## 2016-12-16 ENCOUNTER — Encounter: Payer: Self-pay | Admitting: Podiatry

## 2016-12-16 ENCOUNTER — Ambulatory Visit (INDEPENDENT_AMBULATORY_CARE_PROVIDER_SITE_OTHER): Payer: BLUE CROSS/BLUE SHIELD | Admitting: Podiatry

## 2016-12-16 DIAGNOSIS — M722 Plantar fascial fibromatosis: Secondary | ICD-10-CM

## 2016-12-16 DIAGNOSIS — M779 Enthesopathy, unspecified: Secondary | ICD-10-CM

## 2016-12-16 MED ORDER — TRIAMCINOLONE ACETONIDE 10 MG/ML IJ SUSP
10.0000 mg | Freq: Once | INTRAMUSCULAR | Status: AC
  Administered 2016-12-16: 10 mg

## 2016-12-19 NOTE — Progress Notes (Signed)
Subjective:     Patient ID: Joanne Li, female   DOB: 17-Oct-1961, 56 y.o.   MRN: [id number]  HPI patient states that she still having heel pain but she also has chronic pain in both feet and she should've been here earlier but could not because of insurance   Review of Systems     Objective:   Physical Exam Neurovascular status intact with pain in the plantar feet that's present with deep pain to palpation    Assessment:     Plantar fasciitis    Plan:     Reinjected the plantar fascia and went ahead and scanned for custom orthotic devices at this time

## 2017-01-04 ENCOUNTER — Encounter: Payer: Self-pay | Admitting: Family Medicine

## 2017-01-06 ENCOUNTER — Ambulatory Visit: Payer: BLUE CROSS/BLUE SHIELD

## 2017-01-06 DIAGNOSIS — M722 Plantar fascial fibromatosis: Secondary | ICD-10-CM

## 2017-01-06 NOTE — Patient Instructions (Signed)

## 2017-01-16 NOTE — Progress Notes (Signed)
Patient presents for orthotic pick up.  Verbal and written break in and wear instructions given.  Patient will follow up in 4 weeks if symptoms worsen or fail to improve. 

## 2017-01-19 LAB — HM PAP SMEAR

## 2017-01-23 ENCOUNTER — Encounter: Payer: Self-pay | Admitting: General Practice

## 2017-02-09 ENCOUNTER — Encounter: Payer: Self-pay | Admitting: Family Medicine

## 2017-02-09 ENCOUNTER — Ambulatory Visit (INDEPENDENT_AMBULATORY_CARE_PROVIDER_SITE_OTHER): Payer: BLUE CROSS/BLUE SHIELD | Admitting: Family Medicine

## 2017-02-09 VITALS — BP 120/80 | HR 76 | Temp 98.7°F | Resp 16 | Ht 65.0 in | Wt 210.4 lb

## 2017-02-09 DIAGNOSIS — E0590 Thyrotoxicosis, unspecified without thyrotoxic crisis or storm: Secondary | ICD-10-CM

## 2017-02-09 DIAGNOSIS — K219 Gastro-esophageal reflux disease without esophagitis: Secondary | ICD-10-CM

## 2017-02-09 LAB — TSH: TSH: <0.01 mIU/L — ABNORMAL LOW

## 2017-02-09 LAB — T4, FREE: FREE T4: 1.9 ng/dL — AB (ref 0.8–1.8)

## 2017-02-09 LAB — T3, FREE: T3 FREE: 7.6 pg/mL — AB (ref 2.3–4.2)

## 2017-02-09 MED ORDER — PANTOPRAZOLE SODIUM 40 MG PO TBEC: 40.0000 mg | DELAYED_RELEASE_TABLET | Freq: Every day | ORAL | 1 refills | Status: DC

## 2017-02-09 NOTE — Progress Notes (Signed)
Pre visit review using our clinic review tool, if applicable. No additional management support is needed unless otherwise documented below in the visit note. 

## 2017-02-09 NOTE — Patient Instructions (Signed)
Follow up as needed/scheduled We'll notify you of your lab results and make any changes if needed Start the Protonix daily to decrease acid production and help w/ the reflux Call with any questions or concerns- particularly if not improving Hang in there! Happy Spring!!!

## 2017-02-09 NOTE — Assessment & Plan Note (Signed)
Ongoing issue for pt.  She has not followed up w/ Endo as directed.  Due to fluttering in her neck will check thyroid labs and likely refer back to Endo

## 2017-02-09 NOTE — Assessment & Plan Note (Signed)
Deteriorated.  sxs are no longer controlled w/ Tums.  Start PPI.  Reviewed dietary and lifestyle modifications and monitor for improvement.  This may be the cause of pt's neck fluttering but it may also be thyroid related.  Will follow.

## 2017-02-09 NOTE — Progress Notes (Signed)
   Subjective:    Patient ID: Joanne Li, female    DOB: February 04, 1961, 56 y.o.   MRN: [id number]  HPI Hyperthyroid- pt has not followed up w/ Endo and would like her labs rechecked.  'fluttering sensation'- pt reports she will feel a 'fluttering sensation' in her neck, 'almost like your heart beating up here'.  sxs are intermittent but occurring multiple times/day- typically in the AM.  Pt report sxs will clear w/ coughing.  Increased GERD recently.  Denies palpitations or SOB.  Taking Tums w/o improvement.   Review of Systems For ROS see HPI     Objective:   Physical Exam  Constitutional: She is oriented to person, place, and time. She appears well-developed and well-nourished. No distress.  HENT:  Head: Normocephalic and atraumatic.  Neck: Normal range of motion. Neck supple. No thyromegaly present.  Cardiovascular: Normal rate, regular rhythm, normal heart sounds and intact distal pulses.   Pulmonary/Chest: Effort normal and breath sounds normal. No respiratory distress. She has no wheezes. She has no rales.  Lymphadenopathy:    She has no cervical adenopathy.  Neurological: She is alert and oriented to person, place, and time.  Skin: Skin is warm and dry.  Psychiatric: She has a normal mood and affect. Her behavior is normal. Thought content normal.  Vitals reviewed.         Assessment & Plan:

## 2017-02-10 ENCOUNTER — Telehealth: Payer: Self-pay

## 2017-02-10 ENCOUNTER — Telehealth: Payer: Self-pay | Admitting: Internal Medicine

## 2017-02-10 ENCOUNTER — Encounter: Payer: Self-pay | Admitting: General Practice

## 2017-02-10 ENCOUNTER — Ambulatory Visit: Payer: BLUE CROSS/BLUE SHIELD | Admitting: Podiatry

## 2017-02-10 NOTE — Telephone Encounter (Signed)
Please advise. I do not see an order? Thank you!

## 2017-02-10 NOTE — Telephone Encounter (Signed)
Called and left message, advising patient that the radiology department needed to ask questions, patient cancelled previous appointments, gave number to call them to get scheduled again. Gave our number if she was unable.

## 2017-02-10 NOTE — Telephone Encounter (Signed)
Pt is aware of MDs note

## 2017-02-10 NOTE — Telephone Encounter (Signed)
Yes, I ordered an uptake and scan for her sometime ago. Let's see if this can be done.

## 2017-02-10 NOTE — Telephone Encounter (Signed)
Patient stated is calling to scheduled her RAI test. Please advise

## 2017-02-15 ENCOUNTER — Other Ambulatory Visit: Payer: Self-pay | Admitting: Internal Medicine

## 2017-02-15 DIAGNOSIS — E0590 Thyrotoxicosis, unspecified without thyrotoxic crisis or storm: Secondary | ICD-10-CM

## 2017-02-15 MED ORDER — METHIMAZOLE 5 MG PO TABS: 5.0000 mg | ORAL_TABLET | Freq: Two times a day (BID) | ORAL | 1 refills | Status: DC

## 2017-02-23 ENCOUNTER — Telehealth: Payer: Self-pay

## 2017-02-23 ENCOUNTER — Ambulatory Visit: Payer: Managed Care, Other (non HMO) | Admitting: Internal Medicine

## 2017-02-23 NOTE — Telephone Encounter (Signed)
Patient would like to know the procedure codes for her thyroid uptake.  Please advise

## 2017-02-23 NOTE — Telephone Encounter (Signed)
Returned patient phone call, advised to call billing they may be able to assist her in finding out how much the uptake and scan would be. Advised that we do not do that at our office until after the insurance will run it after the procedure.

## 2017-02-23 NOTE — Telephone Encounter (Signed)
Called and discussed with patient, gave billing number.

## 2017-02-27 ENCOUNTER — Encounter (HOSPITAL_COMMUNITY)
Admission: RE | Admit: 2017-02-27 | Discharge: 2017-02-27 | Disposition: A | Discharge status: Home / Self Care | Payer: BLUE CROSS/BLUE SHIELD | Source: Ambulatory Visit | Attending: Internal Medicine | Admitting: Internal Medicine

## 2017-02-27 DIAGNOSIS — E0590 Thyrotoxicosis, unspecified without thyrotoxic crisis or storm: Secondary | ICD-10-CM | POA: Insufficient documentation

## 2017-02-27 MED ORDER — SODIUM IODIDE I 131 CAPSULE
9.8000 | Freq: Once | INTRAVENOUS | Status: AC | PRN
  Administered 2017-02-27: 9.8 via ORAL

## 2017-02-28 ENCOUNTER — Encounter (HOSPITAL_COMMUNITY)
Admission: RE | Admit: 2017-02-28 | Discharge: 2017-02-28 | Disposition: A | Discharge status: Home / Self Care | Payer: BLUE CROSS/BLUE SHIELD | Source: Ambulatory Visit | Attending: Internal Medicine | Admitting: Internal Medicine

## 2017-02-28 DIAGNOSIS — E0590 Thyrotoxicosis, unspecified without thyrotoxic crisis or storm: Secondary | ICD-10-CM | POA: Diagnosis not present

## 2017-02-28 MED ORDER — SODIUM PERTECHNETATE TC 99M INJECTION
10.8000 | Freq: Once | INTRAVENOUS | Status: AC | PRN
  Administered 2017-02-28: 10.8 via INTRAVENOUS

## 2017-03-01 ENCOUNTER — Telehealth: Payer: Self-pay

## 2017-03-01 ENCOUNTER — Telehealth: Payer: Self-pay | Admitting: Internal Medicine

## 2017-03-01 NOTE — Telephone Encounter (Signed)
Called and LVM advising of Dr.Gherghe's note. Advised patient to call back to make appointment, gave call back number.

## 2017-03-01 NOTE — Telephone Encounter (Signed)
Yes, please continue MMI. Her scan results are back >> Graves disease. Let's schedule a f/u apt in 1.5 mo.

## 2017-03-01 NOTE — Telephone Encounter (Signed)
Pt called in and was wanting to know if she should continue the thyroid medication since she just had her scan done yesterday or wait until the results come in.  She said you may leave a message if she doesn't answer.

## 2017-03-01 NOTE — Telephone Encounter (Signed)
Please advise. Thank you

## 2017-03-21 ENCOUNTER — Telehealth: Payer: Self-pay | Admitting: Family Medicine

## 2017-03-21 NOTE — Telephone Encounter (Signed)
Pt asking for recommendation for sinuses drainage for OTC.

## 2017-03-21 NOTE — Telephone Encounter (Signed)
Patient notified of PCP recommendations and is agreement and expresses an understanding.  

## 2017-03-21 NOTE — Telephone Encounter (Signed)
She could try Mucinex D, Phenylephrine (decongestant), daily Claritin or Zyrtec if not already taking.Marland KitchenMarland Kitchen

## 2017-04-21 ENCOUNTER — Encounter: Payer: Self-pay | Admitting: Internal Medicine

## 2017-04-21 ENCOUNTER — Ambulatory Visit (INDEPENDENT_AMBULATORY_CARE_PROVIDER_SITE_OTHER): Payer: BLUE CROSS/BLUE SHIELD | Admitting: Internal Medicine

## 2017-04-21 VITALS — BP 134/82 | HR 78 | Wt 209.0 lb

## 2017-04-21 DIAGNOSIS — E0500 Thyrotoxicosis with diffuse goiter without thyrotoxic crisis or storm: Secondary | ICD-10-CM

## 2017-04-21 LAB — T4, FREE: FREE T4: 1.36 ng/dL (ref 0.60–1.60)

## 2017-04-21 LAB — T3, FREE: T3 FREE: 4.1 pg/mL (ref 2.3–4.2)

## 2017-04-21 LAB — TSH: TSH: <0.01 u[IU]/mL — ABNORMAL LOW (ref 0.35–4.50)

## 2017-04-21 NOTE — Progress Notes (Signed)
Patient ID: Joanne Li, female   DOB: [birth year], 56 y.o.   MRN: [id number]   HPI  Yuleimy Kretz is a 56 y.o.-year-old female,returning for f/u for thyrotoxicosis,  with a recent diagnosis of Graves' disease. Last visit  5 months ago.  Since last visit,  patient had a thyroid uptake and can on 02/28/2017 c/w Graves' disease, with an uptake of 49% (10-30%).  We started methimazole 5 mg bid.   I reviewed pt's thyroid tests: Lab Results  Component Value Date   TSH <0.01 (L) 02/09/2017   TSH 0.02 (L) 11/01/2016   TSH 0.01 (L) 09/23/2016   TSH 0.16 (L) 11/27/2015   TSH 0.11 (L) 10/27/2015   TSH 1.31 12/04/2013   TSH 1.23 08/21/2012   TSH 1.25 07/25/2011   TSH 1.90 07/23/2010   TSH 1.42 07/22/2009   FREET4 1.9 (H) 02/09/2017   FREET4 1.61 (H) 11/27/2015   FREET4 1.75 (H) 10/28/2015    Component     Latest Ref Rng 11/27/2015  TSI     <140 % baseline 190 (H)   Pt denies: - feeling nodules in neck - hoarseness - dysphagia - choking - SOB with lying down  She denies: - fatigue - weight gain - tremors - anxiety - hair loss - no fatigue - no excessive sweating/heat intolerance, but has occasional hot flushes  Pt does have a FH of thyroid ds. (sister - hypothyroidism). No FH of thyroid cancer. No h/o radiation tx to head or neck.  No seaweed or kelp. No recent contrast studies. No herbal supplements. No Biotin use. No recent steroids use.   ROS: Constitutional: + see HPI Eyes: no blurry vision, no xerophthalmia ENT: no sore throat, no nodules palpated in throat, no dysphagia, no odynophagia, no hoarseness Cardiovascular: no CP/no SOB/no palpitations/no leg swelling Respiratory: + cough/no SOB/no wheezing Gastrointestinal: no N/no V/+ D/no C/no acid reflux Musculoskeletal: no muscle aches/no joint aches Skin: no rashes, no hair loss Neurological: no tremors/no numbness/no tingling/no dizziness  I reviewed pt's medications, allergies, PMH, social hx, family hx, and  changes were documented in the history of present illness. Otherwise, unchanged from my initial visit note.  Past Medical History:  Diagnosis Date  . Hyperlipidemia   . Insomnia   . MVP (mitral valve prolapse)   . Sleep walking    Past Surgical History:  Procedure Laterality Date  . SHOULDER SURGERY  2009   right   Social History   Social History  . Marital Status: Married    Spouse Name: N/A  . Number of Children: 1   Occupational History  . food Therapist, nutritional    Social History Main Topics  . Smoking status: Never Smoker   . Smokeless tobacco: Never Used  . Alcohol Use: Yes     Comment: occasionally  . Drug Use: No   Current Outpatient Prescriptions on File Prior to Visit  Medication Sig Dispense Refill  . Cholecalciferol (VITAMIN D PO) Take 1 tablet by mouth daily.    Marland Kitchen MAGNESIUM PO Take 1 tablet by mouth daily.    . methimazole (TAPAZOLE) 5 MG tablet Take 1 tablet (5 mg total) by mouth 2 (two) times daily. 90 tablet 1  . Omega-3 Fatty Acids (FISH OIL) 1000 MG CAPS Take 1,000 mg by mouth daily.    . pantoprazole (PROTONIX) 40 MG tablet Take 1 tablet (40 mg total) by mouth daily. 30 tablet 1  . Probiotic Product (PROBIOTIC DAILY PO) Take by mouth. Reported on 01/11/2016    .  ALPRAZolam (XANAX) 0.5 MG tablet Take 0.5 tablets (0.25 mg total) by mouth at bedtime as needed for anxiety. (Patient not taking: Reported on 02/09/2017) 90 tablet 0  . EPINEPHrine 0.3 mg/0.3 mL IJ SOAJ injection Inject 0.3 mLs (0.3 mg total) into the muscle once. Reported on 04/25/2016 (Patient not taking: Reported on 11/24/2016) 1 Device 3   No current facility-administered medications on file prior to visit.    Allergies  Allergen Reactions  . Metronidazole     Upset stomach  . Nitrofurantoin     REACTION: sick to stomach  . Wheat Bran Hives    On Body, Face and soles of feet   Family History  Problem Relation Age of Onset  . Arthritis Mother   . Arthritis Father   . Coronary artery  disease Father        s/p quadruple bypass  . Colon cancer Neg Hx    PE: BP 134/82 (BP Location: Left Arm, Patient Position: Sitting)   Pulse 78   Wt 209 lb (94.8 kg)   SpO2 98%   BMI 34.78 kg/m  Body mass index is 34.78 kg/m. Wt Readings from Last 3 Encounters:  04/21/17 209 lb (94.8 kg)  02/09/17 210 lb 6 oz (95.4 kg)  11/24/16 207 lb (93.9 kg)   Constitutional: overweight, in NAD Eyes: PERRLA, EOMI, no exophthalmos ENT: moist mucous membranes, no thyromegaly, no cervical lymphadenopathy Cardiovascular: RRR, No MRG Respiratory: CTA B Gastrointestinal: abdomen soft, NT, ND, BS+ Musculoskeletal: no deformities, strength intact in all 4 Skin: moist, warm, no rashes Neurological: no tremor with outstretched hands, DTR normal in all 4  ASSESSMENT: 1. Graves ds.  PLAN:  1. Patient with a history of mildly low TSH and high fT4, without significant hyperthyroid sxs except hot flushes, now dx'ed as Graves ds on recent Uptake and scan.  - discussed about her new dx, etiology and pathogenesis. Also, we discussed about possible treatments: Methimazole, RAI tx and surgery. She would like to continue with MMI 5 mg bid for now. No SEs. - today, we will recheck TFTs and adjust the dose as needed - I do not feel that we need to add beta blockers at this time, since she is not tachycardic or tremulous - RTC in 6 months but likely earlier for labs   Needs refills.  Component     Latest Ref Rng & Units 02/09/2017 04/21/2017  TSH     0.35 - 4.50 uIU/mL <0.01 (L) <0.01 Repeated and verified X2. (L)  Triiodothyronine,Free,Serum     2.3 - 4.2 pg/mL 7.6 (H) 4.1  T4,Free(Direct)     0.60 - 1.60 ng/dL 1.9 (H) 1.36   Free thyroid hormones improved to normal, while TSH is still low. However, the TSH usually lacks behind the rest of the tests during improvement in Graves' disease.  I would suggest to continue on the current methimazole dose for now, 5 mg twice a day and repeat labs in 6-8  weeks Philemon Kingdom, MD PhD [facility] Endocrinology

## 2017-04-21 NOTE — Patient Instructions (Addendum)
Please stop at the lab.  For now, continue methimazole 5 mg twice a day.  Please return in 6 months.   Hyperthyroidism Hyperthyroidism is when the thyroid is too active (overactive). Your thyroid is a large gland that is located in your neck. The thyroid helps to control how your body uses food (metabolism). When your thyroid is overactive, it produces too much of a hormone called thyroxine. What are the causes? Causes of hyperthyroidism may include:  Graves disease. This is when your immune system attacks the thyroid gland. This is the most common cause.  Inflammation of the thyroid gland.  Tumor in the thyroid gland or somewhere else.  Excessive use of thyroid medicines, including:  Prescription thyroid supplement.  Herbal supplements that mimic thyroid hormones.  Solid or fluid-filled lumps within your thyroid gland (thyroid nodules).  Excessive ingestion of iodine. What increases the risk?  Being female.  Having a family history of thyroid conditions. What are the signs or symptoms? Signs and symptoms of hyperthyroidism may include:  Nervousness.  Inability to tolerate heat.  Unexplained weight loss.  Diarrhea.  Change in the texture of hair or skin.  Heart skipping beats or making extra beats.  Rapid heart rate.  Loss of menstruation.  Shaky hands.  Fatigue.  Restlessness.  Increased appetite.  Sleep problems.  Enlarged thyroid gland or nodules. How is this diagnosed? Diagnosis of hyperthyroidism may include:  Medical history and physical exam.  Blood tests.  Ultrasound tests. How is this treated? Treatment may include:  Medicines to control your thyroid.  Surgery to remove your thyroid.  Radiation therapy. Follow these instructions at home:  Take medicines only as directed by your health care provider.  Do not use any tobacco products, including cigarettes, chewing tobacco, or electronic cigarettes. If you need help quitting, ask  your health care provider.  Do not exercise or do physical activity until your health care provider approves.  Keep all follow-up appointments as directed by your health care provider. This is important. Contact a health care provider if:  Your symptoms do not get better with treatment.  You have fever.  You are taking thyroid replacement medicine and you:  Have depression.  Feel mentally and physically slow.  Have weight gain. Get help right away if:  You have decreased alertness or a change in your awareness.  You have abdominal pain.  You feel dizzy.  You have a rapid heartbeat.  You have an irregular heartbeat. This information is not intended to replace advice given to you by your health care provider. Make sure you discuss any questions you have with your health care provider. Document Released: 11/28/2005 Document Revised: 04/28/2016 Document Reviewed: 04/15/2014 Elsevier Interactive Patient Education  2017 [employer].

## 2017-04-25 MED ORDER — METHIMAZOLE 5 MG PO TABS: 5.0000 mg | ORAL_TABLET | Freq: Two times a day (BID) | ORAL | 1 refills | Status: DC

## 2017-05-02 ENCOUNTER — Telehealth: Payer: Self-pay

## 2017-05-02 NOTE — Telephone Encounter (Signed)
Called and spoke with patient, scheduled her for an appointment. Patient understood, and had no questions.

## 2017-05-09 ENCOUNTER — Other Ambulatory Visit: Payer: Self-pay | Admitting: Internal Medicine

## 2017-07-04 ENCOUNTER — Other Ambulatory Visit: Payer: Self-pay | Admitting: Family Medicine

## 2017-07-04 DIAGNOSIS — Z1231 Encounter for screening mammogram for malignant neoplasm of breast: Secondary | ICD-10-CM

## 2017-07-14 ENCOUNTER — Ambulatory Visit
Admission: RE | Admit: 2017-07-14 | Discharge: 2017-07-14 | Disposition: A | Discharge status: Home / Self Care | Payer: BLUE CROSS/BLUE SHIELD | Source: Ambulatory Visit | Attending: Family Medicine | Admitting: Family Medicine

## 2017-07-14 DIAGNOSIS — Z1231 Encounter for screening mammogram for malignant neoplasm of breast: Secondary | ICD-10-CM

## 2017-08-01 ENCOUNTER — Ambulatory Visit (INDEPENDENT_AMBULATORY_CARE_PROVIDER_SITE_OTHER): Payer: BLUE CROSS/BLUE SHIELD | Admitting: Internal Medicine

## 2017-08-01 ENCOUNTER — Encounter: Payer: Self-pay | Admitting: Internal Medicine

## 2017-08-01 VITALS — BP 122/82 | HR 71 | Wt 204.0 lb

## 2017-08-01 DIAGNOSIS — E0500 Thyrotoxicosis with diffuse goiter without thyrotoxic crisis or storm: Secondary | ICD-10-CM | POA: Diagnosis not present

## 2017-08-01 LAB — TSH: TSH: 2.06 u[IU]/mL (ref 0.35–4.50)

## 2017-08-01 LAB — T4, FREE: FREE T4: 0.62 ng/dL (ref 0.60–1.60)

## 2017-08-01 LAB — T3, FREE: T3, Free: 2.4 pg/mL (ref 2.3–4.2)

## 2017-08-01 MED ORDER — METHIMAZOLE 5 MG PO TABS: ORAL_TABLET | ORAL | 1 refills | Status: DC

## 2017-08-01 NOTE — Patient Instructions (Signed)
Please stop at the lab.  Continue Methimazole 5 mg 2x a day for now.  Please come back for a follow-up appointment in 6 months.

## 2017-08-01 NOTE — Progress Notes (Signed)
Patient ID: Joanne Li, female   DOB: 12/08/1961, 56 y.o.   MRN: [id number]   HPI  Joanne Li is a 56 y.o.-year-old female,returning for f/u for Graves' disease. Last visit  3.5 mo ago.  Reviewed imagine study report: Thyroid uptake and scan on 02/28/2017 c/w Graves' disease, with an uptake of 49% (10-30%).  We started methimazole 5 mg bid >> we continued this dose at last visit, as TFTs were improved.  I reviewed pt's thyroid tests: Lab Results  Component Value Date   TSH <0.01 Repeated and verified X2. (L) 04/21/2017   TSH <0.01 (L) 02/09/2017   TSH 0.02 (L) 11/01/2016   TSH 0.01 (L) 09/23/2016   TSH 0.16 (L) 11/27/2015   TSH 0.11 (L) 10/27/2015   TSH 1.31 12/04/2013   TSH 1.23 08/21/2012   TSH 1.25 07/25/2011   TSH 1.90 07/23/2010   FREET4 1.36 04/21/2017   FREET4 1.9 (H) 02/09/2017   FREET4 1.61 (H) 11/27/2015   FREET4 1.75 (H) 10/28/2015    Lab Results  Component Value Date   TSI 190 (H) 11/27/2015   Pt denies: - feeling nodules in neck - hoarseness - dysphagia - choking - SOB with lying down  Pt denies: - unintentional weight loss >> she lost 6 lbs since last visit intentionally (cut back on carbs, sodas) - heat intolerance - tremors - palpitations - anxiety - hyperdefecation - hair loss  Pt does have a FH of thyroid ds. (sister - hypothyroidism). No FH of thyroid cancer. No h/o radiation tx to head or neck.  No seaweed or kelp. No recent contrast studies. No herbal supplements. No Biotin use. No recent steroids use.   ROS: Constitutional: + see HPI Eyes: no blurry vision, no xerophthalmia ENT: no sore throat, + see HPI Cardiovascular: no CP/no SOB/no palpitations/no leg swelling Respiratory: no cough/no SOB/no wheezing Gastrointestinal: no N/no V/no D/no C/no acid reflux Musculoskeletal: no muscle aches/no joint aches Skin: no rashes, no hair loss Neurological: no tremors/no numbness/no tingling/no dizziness  I reviewed pt's medications,  allergies, PMH, social hx, family hx, and changes were documented in the history of present illness. Otherwise, unchanged from my initial visit note. Started Fish Oil, Aleve.  Past Medical History:  Diagnosis Date  . Hyperlipidemia   . Insomnia   . MVP (mitral valve prolapse)   . Sleep walking    Past Surgical History:  Procedure Laterality Date  . SHOULDER SURGERY  2009   right   Social History   Social History  . Marital Status: Married    Spouse Name: N/A  . Number of Children: 1   Occupational History  . food Therapist, nutritional    Social History Main Topics  . Smoking status: Never Smoker   . Smokeless tobacco: Never Used  . Alcohol Use: Yes     Comment: occasionally  . Drug Use: No   Current Outpatient Prescriptions on File Prior to Visit  Medication Sig Dispense Refill  . Cholecalciferol (VITAMIN D PO) Take 1 tablet by mouth daily.    Marland Kitchen MAGNESIUM PO Take 1 tablet by mouth daily.    . methimazole (TAPAZOLE) 5 MG tablet Take 1 tablet (5 mg total) by mouth 2 (two) times daily. 90 tablet 1  . methimazole (TAPAZOLE) 5 MG tablet TAKE 1 TABLET BY MOUTH TWICE A DAY 90 tablet 1  . Omega-3 Fatty Acids (FISH OIL) 1000 MG CAPS Take 1,000 mg by mouth daily.    . pantoprazole (PROTONIX) 40 MG tablet Take 1 tablet (  40 mg total) by mouth daily. 30 tablet 1  . Probiotic Product (PROBIOTIC DAILY PO) Take by mouth. Reported on 01/11/2016    . ALPRAZolam (XANAX) 0.5 MG tablet Take 0.5 tablets (0.25 mg total) by mouth at bedtime as needed for anxiety. (Patient not taking: Reported on 02/09/2017) 90 tablet 0  . EPINEPHrine 0.3 mg/0.3 mL IJ SOAJ injection Inject 0.3 mLs (0.3 mg total) into the muscle once. Reported on 04/25/2016 (Patient not taking: Reported on 11/24/2016) 1 Device 3   No current facility-administered medications on file prior to visit.    Allergies  Allergen Reactions  . Metronidazole     Upset stomach  . Nitrofurantoin     REACTION: sick to stomach  . Wheat Bran Hives     On Body, Face and soles of feet   Family History  Problem Relation Age of Onset  . Arthritis Mother   . Arthritis Father   . Coronary artery disease Father        s/p quadruple bypass  . Colon cancer Neg Hx    PE: BP 122/82 (BP Location: Left Arm, Patient Position: Sitting)   Pulse 71   Wt 204 lb (92.5 kg)   SpO2 98%   BMI 33.95 kg/m  Body mass index is 33.95 kg/m. Wt Readings from Last 3 Encounters:  08/01/17 204 lb (92.5 kg)  04/21/17 209 lb (94.8 kg)  02/09/17 210 lb 6 oz (95.4 kg)   Constitutional: overweight, in NAD Eyes: PERRLA, EOMI, no exophthalmos ENT: moist mucous membranes, no thyromegaly, no cervical lymphadenopathy Cardiovascular: RRR, No MRG Respiratory: CTA B Gastrointestinal: abdomen soft, NT, ND, BS+ Musculoskeletal: no deformities, strength intact in all 4 Skin: moist, warm, no rashes Neurological: no tremor with outstretched hands, DTR normal in all 4  ASSESSMENT: 1. Graves ds.  PLAN:  1. Patient with History of a mildly low TSH and high free T4, confirmed as Graves' disease per uptake and scan performed in 02/2017. She did not have significant hyperthyroid symptoms except hot flashes, which could be menopausal. We started methimazole 5 mg twice a day at her TFTs improved at the time of the last visit. Therefore, we continued the same dose, which she still takes today. At today's visit, we will repeat her TFTs and I am hoping that we can decrease the methimazole dose even further.  - We discussed about the knee or studies with methimazole, that showed that a low dose of methimazole is safe and effective in preventing recurrences of Graves' disease if continued long-term -  she is aware of other modalities of treatment for Graves' disease: RAI treatment and surgery, however, she is responding well to methimazole, and I would not suggest to proceed with these for now  - today, we will recheck TFTs  and adjust the dose of methimazole as needed  - I do not  feel the need to add a beta blocker today, since she is not tremulous or tachycardic  -  I will see her back in 6 months, but likely earlier for labs  Component     Latest Ref Rng & Units 08/01/2017  TSH     0.35 - 4.50 uIU/mL 2.06  Triiodothyronine,Free,Serum     2.3 - 4.2 pg/mL 2.4  T4,Free(Direct)     0.60 - 1.60 ng/dL 0.62   Normal TFTs >> will attempt to decrease the pm dose of MMi to 2.5 mg and recheck her labs in 1.5 mo.  Philemon Kingdom, MD PhD [facility] Endocrinology

## 2017-08-22 ENCOUNTER — Encounter: Payer: Self-pay | Admitting: Internal Medicine

## 2017-08-22 ENCOUNTER — Ambulatory Visit (INDEPENDENT_AMBULATORY_CARE_PROVIDER_SITE_OTHER): Payer: BLUE CROSS/BLUE SHIELD | Admitting: Internal Medicine

## 2017-08-22 VITALS — BP 134/80 | HR 79 | Wt 203.0 lb

## 2017-08-22 DIAGNOSIS — E0500 Thyrotoxicosis with diffuse goiter without thyrotoxic crisis or storm: Secondary | ICD-10-CM

## 2017-08-22 NOTE — Progress Notes (Signed)
Patient ID: Joanne Li, female   DOB: April 07, 1961, 56 y.o.   MRN: [id number]   HPI  Joanne Li is a 56 y.o.-year-old female,returning for f/u for Graves' disease. Last visit  1 mo ago.  Reviewed imaging study report: Thyroid uptake and scan on 02/28/2017 c/w Graves' disease, with an uptake of 49% (10-30%).  We started methimazole 5 mg bid >> TFTs were improved >> 3 weeks ago we decreased the dose to 5 mg in a.m. and 2.5 mg in p.m. she continues this dose today.    I reviewed pt's thyroid tests: Lab Results  Component Value Date   TSH 2.06 08/01/2017   TSH <0.01 Repeated and verified X2. (L) 04/21/2017   TSH <0.01 (L) 02/09/2017   TSH 0.02 (L) 11/01/2016   TSH 0.01 (L) 09/23/2016   TSH 0.16 (L) 11/27/2015   TSH 0.11 (L) 10/27/2015   TSH 1.31 12/04/2013   TSH 1.23 08/21/2012   TSH 1.25 07/25/2011   FREET4 0.62 08/01/2017   FREET4 1.36 04/21/2017   FREET4 1.9 (H) 02/09/2017   FREET4 1.61 (H) 11/27/2015   FREET4 1.75 (H) 10/28/2015    Lab Results  Component Value Date   TSI 190 (H) 11/27/2015   Pt denies: - feeling nodules in neck - hoarseness - dysphagia - choking - SOB with lying down  Pt denies: - weight loss - heat intolerance - tremors - palpitations - anxiety - hyperdefecation - hair loss  Pt does have a FH of thyroid ds. (sister - hypothyroidism). No FH of thyroid cancer. No h/o radiation tx to head or neck.  No seaweed or kelp. No recent contrast studies. No herbal supplements. No Biotin use. No recent steroids use.   ROS: Constitutional: no weight gain/no weight loss, no fatigue, no subjective hyperthermia, no subjective hypothermia Eyes: no blurry vision, no xerophthalmia ENT: no sore throat, no nodules palpated in throat, no dysphagia, no odynophagia, no hoarseness Cardiovascular: no CP/no SOB/no palpitations/no leg swelling Respiratory: no cough/no SOB/no wheezing Gastrointestinal: no N/no V/no D/no C/no acid reflux Musculoskeletal: no muscle  aches/no joint aches Skin: no rashes, no hair loss Neurological: no tremors/no numbness/no tingling/no dizziness  I reviewed pt's medications, allergies, PMH, social hx, family hx, and changes were documented in the history of present illness. Otherwise, unchanged from my initial visit note.  Past Medical History:  Diagnosis Date  . Hyperlipidemia   . Insomnia   . MVP (mitral valve prolapse)   . Sleep walking    Past Surgical History:  Procedure Laterality Date  . SHOULDER SURGERY  2009   right   Social History   Social History  . Marital Status: Married    Spouse Name: N/A  . Number of Children: 1   Occupational History  . food Therapist, nutritional    Social History Main Topics  . Smoking status: Never Smoker   . Smokeless tobacco: Never Used  . Alcohol Use: Yes     Comment: occasionally  . Drug Use: No   Current Outpatient Prescriptions on File Prior to Visit  Medication Sig Dispense Refill  . Cholecalciferol (VITAMIN D PO) Take 1 tablet by mouth daily.    Marland Kitchen MAGNESIUM PO Take 1 tablet by mouth daily.    . methimazole (TAPAZOLE) 5 MG tablet Take 5 mg in am and 2.5 mg in pm 90 tablet 1  . Omega-3 Fatty Acids (FISH OIL) 1000 MG CAPS Take 1,000 mg by mouth daily.    . pantoprazole (PROTONIX) 40 MG tablet Take 1  tablet (40 mg total) by mouth daily. 30 tablet 1  . Probiotic Product (PROBIOTIC DAILY PO) Take by mouth. Reported on 01/11/2016    . ALPRAZolam (XANAX) 0.5 MG tablet Take 0.5 tablets (0.25 mg total) by mouth at bedtime as needed for anxiety. (Patient not taking: Reported on 02/09/2017) 90 tablet 0  . EPINEPHrine 0.3 mg/0.3 mL IJ SOAJ injection Inject 0.3 mLs (0.3 mg total) into the muscle once. Reported on 04/25/2016 (Patient not taking: Reported on 11/24/2016) 1 Device 3   No current facility-administered medications on file prior to visit.    Allergies  Allergen Reactions  . Metronidazole     Upset stomach  . Nitrofurantoin     REACTION: sick to stomach  . Wheat  Bran Hives    On Body, Face and soles of feet   Family History  Problem Relation Age of Onset  . Arthritis Mother   . Arthritis Father   . Coronary artery disease Father        s/p quadruple bypass  . Colon cancer Neg Hx    PE: BP 134/80 (BP Location: Left Arm, Patient Position: Sitting)   Pulse 79   Wt 203 lb (92.1 kg)   SpO2 97%   BMI 33.78 kg/m  Body mass index is 33.78 kg/m. Wt Readings from Last 3 Encounters:  08/22/17 203 lb (92.1 kg)  08/01/17 204 lb (92.5 kg)  04/21/17 209 lb (94.8 kg)   Constitutional: overweight, in NAD Not performed.  ASSESSMENT: 1. Graves ds.  PLAN:  1. Patient with History of a mildly low TSH high free T4, confirmed that Graves' disease per uptake and scan performed in 02/2017. She did not have significant hyperthyroid symptoms except hot flashes, which could be menopausal. We initially started methimazole 5 mg twice a day and her TFTs improved. 3 weeks ago, we were able to decrease the methimazole further: 5 mg in a.m. and 2.5 mg in pm - At last visit, we discussed about the nearest studies with methimazole, which showed that a low dose of methimazole is safe and effective in preventing recurrences of Graves' disease if continued long-term. She agrees to continue with this long-term if needed. - We again reviewed other modalities of treatment for Graves' disease: RAI treatment or surgery, however, none are needed for now. - she does not need addition of a beta blocker as she is not tachycardic or tremulous.  -  I will see her back in  months, but likely earlier for labs  Philemon Kingdom, MD PhD [facility] Endocrinology

## 2017-10-27 ENCOUNTER — Other Ambulatory Visit: Payer: Self-pay | Admitting: Internal Medicine

## 2017-11-07 ENCOUNTER — Ambulatory Visit (INDEPENDENT_AMBULATORY_CARE_PROVIDER_SITE_OTHER): Payer: BLUE CROSS/BLUE SHIELD | Admitting: Family Medicine

## 2017-11-07 ENCOUNTER — Encounter: Payer: Self-pay | Admitting: Family Medicine

## 2017-11-07 ENCOUNTER — Other Ambulatory Visit: Payer: Self-pay

## 2017-11-07 VITALS — BP 118/83 | HR 78 | Temp 98.1°F | Resp 16 | Ht 65.0 in | Wt 207.1 lb

## 2017-11-07 DIAGNOSIS — E785 Hyperlipidemia, unspecified: Secondary | ICD-10-CM | POA: Diagnosis not present

## 2017-11-07 DIAGNOSIS — Z0000 Encounter for general adult medical examination without abnormal findings: Secondary | ICD-10-CM

## 2017-11-07 LAB — LIPID PANEL
CHOLESTEROL: 263 mg/dL — AB (ref 0–200)
HDL: 71.4 mg/dL (ref >39.00)
LDL CALC: 174 mg/dL — AB (ref 0–99)
NonHDL: 191.76
TRIGLYCERIDES: 90 mg/dL (ref 0.0–149.0)
Total CHOL/HDL Ratio: 4
VLDL: 18 mg/dL (ref 0.0–40.0)

## 2017-11-07 LAB — BASIC METABOLIC PANEL
BUN: 12 mg/dL (ref 6–23)
CHLORIDE: 106 meq/L (ref 96–112)
CO2: 28 mEq/L (ref 19–32)
Calcium: 9.6 mg/dL (ref 8.4–10.5)
Creatinine, Ser: 0.75 mg/dL (ref 0.40–1.20)
GFR: 102.63 mL/min (ref >60.00)
Glucose, Bld: 85 mg/dL (ref 70–99)
POTASSIUM: 4.8 meq/L (ref 3.5–5.1)
SODIUM: 141 meq/L (ref 135–145)

## 2017-11-07 LAB — TSH: TSH: 5.37 u[IU]/mL — ABNORMAL HIGH (ref 0.35–4.50)

## 2017-11-07 LAB — CBC WITH DIFFERENTIAL/PLATELET
BASOS ABS: 0 10*3/uL (ref 0.0–0.1)
Basophils Relative: 1.1 % (ref 0.0–3.0)
EOS ABS: 0.1 10*3/uL (ref 0.0–0.7)
Eosinophils Relative: 2.4 % (ref 0.0–5.0)
HCT: 44.2 % (ref 36.0–46.0)
Hemoglobin: 14.2 g/dL (ref 12.0–15.0)
LYMPHS ABS: 1.4 10*3/uL (ref 0.7–4.0)
Lymphocytes Relative: 36.3 % (ref 12.0–46.0)
MCHC: 32.2 g/dL (ref 30.0–36.0)
MCV: 89 fl (ref 78.0–100.0)
MONO ABS: 0.3 10*3/uL (ref 0.1–1.0)
Monocytes Relative: 7.8 % (ref 3.0–12.0)
NEUTROS PCT: 52.4 % (ref 43.0–77.0)
Neutro Abs: 2 10*3/uL (ref 1.4–7.7)
PLATELETS: 201 10*3/uL (ref 150.0–400.0)
RBC: 4.96 Mil/uL (ref 3.87–5.11)
RDW: 14.5 % (ref 11.5–15.5)
WBC: 3.9 10*3/uL — ABNORMAL LOW (ref 4.0–10.5)

## 2017-11-07 LAB — HEPATIC FUNCTION PANEL
ALBUMIN: 4.2 g/dL (ref 3.5–5.2)
ALK PHOS: 65 U/L (ref 39–117)
ALT: 14 U/L (ref 0–35)
AST: 14 U/L (ref 0–37)
Bilirubin, Direct: 0.1 mg/dL (ref 0.0–0.3)
TOTAL PROTEIN: 6.9 g/dL (ref 6.0–8.3)
Total Bilirubin: 0.4 mg/dL (ref 0.2–1.2)

## 2017-11-07 NOTE — Progress Notes (Signed)
   Subjective:    Patient ID: Joanne Li, female    DOB: [birth year], 56 y.o.   MRN: [id number]  HPI CPE- UTD on mammo, pap, colonoscopy.  UTD on Tdap.  No regular exercise   Review of Systems Patient reports no vision/ hearing changes, adenopathy,fever, weight change,  persistant/recurrent hoarseness , swallowing issues, chest pain, palpitations, edema, persistant/recurrent cough, hemoptysis, dyspnea (rest/exertional/paroxysmal nocturnal), gastrointestinal bleeding (melena, rectal bleeding), abdominal pain, significant heartburn, bowel changes, GU symptoms (dysuria, hematuria, incontinence), Gyn symptoms (abnormal  bleeding, pain),  syncope, focal weakness, memory loss, numbness & tingling, skin/hair/nail changes, abnormal bruising or bleeding, anxiety, or depression.     Objective:   Physical Exam General Appearance:    Alert, cooperative, no distress, appears stated age, obese  Head:    Normocephalic, without obvious abnormality, atraumatic  Eyes:    PERRL, conjunctiva/corneas clear, EOM's intact, fundi    benign, both eyes  Ears:    Normal TM's and external ear canals, both ears  Nose:   Nares normal, septum midline, mucosa normal, no drainage    or sinus tenderness  Throat:   Lips, mucosa, and tongue normal; teeth and gums normal  Neck:   Supple, symmetrical, trachea midline, no adenopathy;    Thyroid: no enlargement/tenderness/nodules  Back:     Symmetric, no curvature, ROM normal, no CVA tenderness  Lungs:     Clear to auscultation bilaterally, respirations unlabored  Chest Wall:    No tenderness or deformity   Heart:    Regular rate and rhythm, S1 and S2 normal, no murmur, rub   or gallop  Breast Exam:    Deferred to GYN  Abdomen:     Soft, non-tender, bowel sounds active all four quadrants,    no masses, no organomegaly  Genitalia:    Deferred to GYN  Rectal:    Extremities:   Extremities normal, atraumatic, no cyanosis or edema  Pulses:   2+ and symmetric all extremities    Skin:   Skin color, texture, turgor normal, no rashes or lesions  Lymph nodes:   Cervical, supraclavicular, and axillary nodes normal  Neurologic:   CNII-XII intact, normal strength, sensation and reflexes    throughout          Assessment & Plan:

## 2017-11-07 NOTE — Assessment & Plan Note (Signed)
Pt's PE WNL w/ exception of obesity.  UTD on pap, mammo, colonoscopy, Tdap.  Declines flu.  Check labs.  Anticipatory guidance provided.  

## 2017-11-07 NOTE — Assessment & Plan Note (Signed)
Ongoing issue.  Attempting to control w/ diet and exercise but pt reports poor efforts on each.  Check labs.  Start meds prn.

## 2017-11-07 NOTE — Patient Instructions (Signed)
Follow up in 1 year or as needed We'll notify you of your lab results and make any changes if needed Continue to work on healthy diet and regular exercise- you can do it! Call with any questions or concerns Happy Holidays!! 

## 2017-11-08 ENCOUNTER — Other Ambulatory Visit: Payer: Self-pay | Admitting: Internal Medicine

## 2017-11-08 ENCOUNTER — Other Ambulatory Visit: Payer: Self-pay | Admitting: Family Medicine

## 2017-11-08 ENCOUNTER — Other Ambulatory Visit: Payer: Self-pay | Admitting: General Practice

## 2017-11-08 DIAGNOSIS — E785 Hyperlipidemia, unspecified: Secondary | ICD-10-CM

## 2017-11-08 DIAGNOSIS — E039 Hypothyroidism, unspecified: Secondary | ICD-10-CM

## 2017-11-08 DIAGNOSIS — E0500 Thyrotoxicosis with diffuse goiter without thyrotoxic crisis or storm: Secondary | ICD-10-CM

## 2017-11-08 MED ORDER — METHIMAZOLE 5 MG PO TABS: ORAL_TABLET | ORAL | 5 refills | Status: DC

## 2017-11-08 MED ORDER — LEVOTHYROXINE SODIUM 75 MCG PO TABS: 75.0000 ug | ORAL_TABLET | Freq: Every day | ORAL | 6 refills | Status: DC

## 2017-11-08 MED ORDER — ATORVASTATIN CALCIUM 20 MG PO TABS: 20.0000 mg | ORAL_TABLET | Freq: Every day | ORAL | 6 refills | Status: DC

## 2017-11-09 ENCOUNTER — Encounter: Payer: Self-pay | Admitting: Family Medicine

## 2017-12-15 ENCOUNTER — Encounter: Payer: Self-pay | Admitting: Internal Medicine

## 2017-12-20 ENCOUNTER — Encounter: Payer: Self-pay | Admitting: Internal Medicine

## 2018-02-01 ENCOUNTER — Encounter: Payer: Self-pay | Admitting: Internal Medicine

## 2018-02-01 ENCOUNTER — Ambulatory Visit: Payer: BLUE CROSS/BLUE SHIELD | Admitting: Internal Medicine

## 2018-02-01 VITALS — BP 122/80 | HR 68 | Ht 66.0 in | Wt 216.0 lb

## 2018-02-01 DIAGNOSIS — E0500 Thyrotoxicosis with diffuse goiter without thyrotoxic crisis or storm: Secondary | ICD-10-CM | POA: Diagnosis not present

## 2018-02-01 LAB — TSH: TSH: 2.17 u[IU]/mL (ref 0.35–4.50)

## 2018-02-01 LAB — T4, FREE: Free T4: 0.72 ng/dL (ref 0.60–1.60)

## 2018-02-01 LAB — T3, FREE: T3 FREE: 3.3 pg/mL (ref 2.3–4.2)

## 2018-02-01 NOTE — Progress Notes (Signed)
Patient ID: Joanne Li, female   DOB: November 03, 1961, 57 y.o.   MRN: [id number]   HPI  Joanne Li is a 57 y.o.-year-old female,returning for f/u for Graves' disease. Last visit 5 months ago.  She has a URI. She has post nasal drip and hoarseness.  Reviewed imaging study report: Thyroid uptake and scan on 02/28/2017 c/w Graves' disease, with an uptake of 49% (10-30%).  We started methimazole 5 mg twice daily and were able to decrease the dose gradually to 5 mg daily in 10/2017.  She has no side effects from methimazole.  We reviewed together her thyroid tests Lab Results  Component Value Date   TSH 5.37 (H) 11/07/2017   TSH 2.06 08/01/2017   TSH <0.01 Repeated and verified X2. (L) 04/21/2017   TSH <0.01 (L) 02/09/2017   TSH 0.02 (L) 11/01/2016   TSH 0.01 (L) 09/23/2016   TSH 0.16 (L) 11/27/2015   TSH 0.11 (L) 10/27/2015   TSH 1.31 12/04/2013   TSH 1.23 08/21/2012   FREET4 0.62 08/01/2017   FREET4 1.36 04/21/2017   FREET4 1.9 (H) 02/09/2017   FREET4 1.61 (H) 11/27/2015   FREET4 1.75 (H) 10/28/2015   Her Graves' antibodies were elevated: Lab Results  Component Value Date   TSI 190 (H) 11/27/2015   Pt denies: - feeling nodules in neck - dysphagia - choking - SOB with lying down  But has: - hoarseness - URI  Pt does have a FH of thyroid ds. (sister - hypothyroidism). No FH of thyroid cancer. No h/o radiation tx to head or neck.  No seaweed or kelp. No recent contrast studies. No herbal supplements. No Biotin use. No recent steroids use.   ROS: Constitutional: no weight gain/no weight loss, no fatigue,+ subjective hyperthermia (hot flushes), no subjective hypothermia Eyes: no blurry vision, no xerophthalmia ENT: no sore throat, + see HPI Cardiovascular: no CP/+ SOB/+ palpitations/no leg swelling Respiratory: no cough/+ SOB/no wheezing Gastrointestinal: no N/no V/no D/no C/no acid reflux Musculoskeletal: no muscle aches/no joint aches Skin: no rashes, no hair  loss Neurological: no tremors/no numbness/no tingling/no dizziness  I reviewed pt's medications, allergies, PMH, social hx, family hx, and changes were documented in the history of present illness. Otherwise, unchanged from my initial visit note.  Past Medical History:  Diagnosis Date  . Hyperlipidemia   . Insomnia   . MVP (mitral valve prolapse)   . Sleep walking    Past Surgical History:  Procedure Laterality Date  . SHOULDER SURGERY  2009   right   Social History   Social History  . Marital Status: Married    Spouse Name: N/A  . Number of Children: 1   Occupational History  . food Therapist, nutritional    Social History Main Topics  . Smoking status: Never Smoker   . Smokeless tobacco: Never Used  . Alcohol Use: Yes     Comment: occasionally  . Drug Use: No   Current Outpatient Medications on File Prior to Visit  Medication Sig Dispense Refill  . atorvastatin (LIPITOR) 20 MG tablet Take 1 tablet (20 mg total) by mouth daily. 30 tablet 6  . Cholecalciferol (VITAMIN D PO) Take 1 tablet by mouth daily.    Marland Kitchen EPINEPHrine 0.3 mg/0.3 mL IJ SOAJ injection Inject 0.3 mLs (0.3 mg total) into the muscle once. Reported on 04/25/2016 1 Device 3  . MAGNESIUM PO Take 1 tablet by mouth daily.    . methimazole (TAPAZOLE) 5 MG tablet TAKE 1 TABLET A DAY 60 tablet  5  . Omega-3 Fatty Acids (FISH OIL) 1000 MG CAPS Take 1,000 mg by mouth daily.     No current facility-administered medications on file prior to visit.    Allergies  Allergen Reactions  . Metronidazole     Upset stomach  . Nitrofurantoin     REACTION: sick to stomach  . Wheat Bran Hives    On Body, Face and soles of feet   Family History  Problem Relation Age of Onset  . Arthritis Mother   . Arthritis Father   . Coronary artery disease Father        s/p quadruple bypass  . Colon cancer Neg Hx    PE: BP 122/80   Pulse 68   Ht 5\' 6"  (1.676 m)   Wt 216 lb (98 kg)   SpO2 97%   BMI 34.86 kg/m  Body mass index is  34.86 kg/m. Wt Readings from Last 3 Encounters:  02/01/18 216 lb (98 kg)  11/07/17 207 lb 2 oz (94 kg)  08/22/17 203 lb (92.1 kg)   Constitutional: overweight, in NAD Eyes: PERRLA, EOMI, no exophthalmos ENT: moist mucous membranes, no thyromegaly, no cervical lymphadenopathy Cardiovascular: RRR, No MRG Respiratory: CTA B Gastrointestinal: abdomen soft, NT, ND, BS+ Musculoskeletal: no deformities, strength intact in all 4 Skin: moist, warm, no rashes Neurological: no tremor with outstretched hands, DTR normal in all 4   ASSESSMENT: 1. Graves ds.  PLAN:  1. Patient with history of a mildly low TSH and high free T4, confirmed as Graves' disease per uptake and scan performed in 02/2017.  She did not have significant hypothyroid symptoms except hot flashes, which could be menopausal.  We initially started methimazole 5 mg twice a day and her TFTs improved.  We then decrease the dose to 5 mg in a.m. and 2.5 mg in p.m.  In 10/2017 we had to decrease the dose further to 5 mg in a.m., as her TSH was slightly high.  She continues on this dose now. -We again reviewed other modalities for treatment for Graves' disease: RAI treatment or surgery, however, none are needed for now, since she is responding so well to methimazole.  We discussed about the new studies showing that low-dose methimazole can be continued safely and this is effective in preventing recurrences of Graves' disease.  We also discussed about the possibility of starting levothyroxine depending on her results and I advised her how to take this correctly in case we need to start:every day, with water, at least 30 minutes before breakfast, separated by at least 4 hours from: - acid reflux medications - calcium - iron - multivitamins -She also does not need addition of a beta-blocker if she is not tachycardic or tremulous -We will recheck her TFTs today. We will also add TSI antibodies to check for her Graves' disease activity. -will  have her back in 6 months but possibly earlier for repeat labs.  Needs refills.  - time spent with the patient: 15 min, of which >50% was spent in obtaining information about her symptoms, reviewing her previous labs, evaluations, and treatments, counseling her about her condition (please see the discussed topics above), and developing a plan to further treat it.  Office Visit on 02/01/2018  Component Date Value Ref Range Status  . TSH 02/01/2018 2.17  0.35 - 4.50 uIU/mL Final  . Free T4 02/01/2018 0.72  0.60 - 1.60 ng/dL Final   Comment: Specimens from patients who are undergoing biotin therapy and /or ingesting biotin  supplements may contain high levels of biotin.  The higher biotin concentration in these specimens interferes with this Free T4 assay.  Specimens that contain high levels  of biotin may cause false high results for this Free T4 assay.  Please interpret results in light of the total clinical presentation of the patient.    . T3, Free 02/01/2018 3.3  2.3 - 4.2 pg/mL Final   TSI still pending.  I would suggest to continue the current dose of methimazole for now and repeat her tests in 3 months.  Philemon Kingdom, MD PhD [facility] Endocrinology

## 2018-02-01 NOTE — Patient Instructions (Signed)
Please stop at the lab.  Continue Methimazole 5 mg daily for now.  Please come back for a follow-up appointment in 6 months.

## 2018-02-02 MED ORDER — METHIMAZOLE 5 MG PO TABS: ORAL_TABLET | ORAL | 3 refills | Status: DC

## 2018-02-06 LAB — THYROID STIMULATING IMMUNOGLOBULIN: TSI: 121 % baseline (ref <140)

## 2018-02-19 ENCOUNTER — Ambulatory Visit (AMBULATORY_SURGERY_CENTER): Payer: Self-pay | Admitting: *Deleted

## 2018-02-19 ENCOUNTER — Other Ambulatory Visit: Payer: Self-pay

## 2018-02-19 VITALS — Ht 65.0 in | Wt 218.0 lb

## 2018-02-19 DIAGNOSIS — Z86010 Personal history of colonic polyps: Secondary | ICD-10-CM

## 2018-02-19 MED ORDER — NA SULFATE-K SULFATE-MG SULF 17.5-3.13-1.6 GM/177ML PO SOLN: ORAL | 0 refills | Status: DC

## 2018-02-19 NOTE — Progress Notes (Signed)
Patient denies any allergies to eggs or soy. Patient denies any problems with anesthesia/sedation. Patient denies any oxygen use at home. Patient denies taking any diet/weight loss medications or blood thinners. EMMI education assisgned to patient on colonoscopy, this was explained and instructions given to patient. 

## 2018-02-27 LAB — HM PAP SMEAR

## 2018-03-05 ENCOUNTER — Ambulatory Visit (AMBULATORY_SURGERY_CENTER): Payer: BLUE CROSS/BLUE SHIELD | Admitting: Internal Medicine

## 2018-03-05 ENCOUNTER — Encounter: Payer: Self-pay | Admitting: Internal Medicine

## 2018-03-05 VITALS — BP 114/79 | HR 70 | Temp 98.9°F | Resp 19 | Ht 66.0 in | Wt 216.0 lb

## 2018-03-05 DIAGNOSIS — D122 Benign neoplasm of ascending colon: Secondary | ICD-10-CM

## 2018-03-05 DIAGNOSIS — Z86010 Personal history of colonic polyps: Secondary | ICD-10-CM

## 2018-03-05 DIAGNOSIS — D120 Benign neoplasm of cecum: Secondary | ICD-10-CM

## 2018-03-05 MED ORDER — SODIUM CHLORIDE 0.9 % IV SOLN
500.0000 mL | Freq: Once | INTRAVENOUS | Status: DC
Start: 2018-03-05 — End: 2018-09-03

## 2018-03-05 NOTE — Progress Notes (Signed)
A and O x3. Report to RN. Tolerated MAC anesthesia well.

## 2018-03-05 NOTE — Progress Notes (Signed)
Pt's states no medical or surgical changes since previsit or office visit. 

## 2018-03-05 NOTE — Op Note (Addendum)
[place] Patient Name: Joanne Li Procedure Date: 03/05/2018 9:25 AM MRN: [id number] Endoscopist: Jerene Bears , MD Age: 57 Referring MD:  Date of Birth: 1961-05-02 Gender: Female Account #: [id number] Procedure:                Colonoscopy Indications:              Surveillance: Personal history of adenomatous                            polyps on last colonoscopy 5 years ago Medicines:                Monitored Anesthesia Care Procedure:                Pre-Anesthesia Assessment:                           - Prior to the procedure, a History and Physical                            was performed, and patient medications and                            allergies were reviewed. The patient's tolerance of                            previous anesthesia was also reviewed. The risks                            and benefits of the procedure and the sedation                            options and risks were discussed with the patient.                            All questions were answered, and informed consent                            was obtained. Prior Anticoagulants: The patient has                            taken no previous anticoagulant or antiplatelet                            agents. ASA Grade Assessment: II - A patient with                            mild systemic disease. After reviewing the risks                            and benefits, the patient was deemed in                            satisfactory condition to undergo the procedure.  After obtaining informed consent, the colonoscope                            was passed under direct vision. Throughout the                            procedure, the patient's blood pressure, pulse, and                            oxygen saturations were monitored continuously. The                            Colonoscope was introduced through the anus and                            advanced to the the cecum,  identified by                            appendiceal orifice and ileocecal valve. The                            colonoscopy was performed without difficulty. The                            patient tolerated the procedure well. The quality                            of the bowel preparation was good. The ileocecal                            valve, appendiceal orifice, and rectum were                            photographed. Scope In: 9:35:56 AM Scope Out: 9:46:55 AM Scope Withdrawal Time: 0 hours 9 minutes 9 seconds  Total Procedure Duration: 0 hours 10 minutes 59 seconds  Findings:                 The digital rectal exam was normal.                           Two sessile polyps were found in the ascending                            colon and cecum. The polyps were 3 to 4 mm in size.                            These polyps were removed with a cold snare.                            Resection and retrieval were complete.                           The exam was otherwise without abnormality on  direct and retroflexion views. Complications:            No immediate complications. Estimated Blood Loss:     Estimated blood loss was minimal. Impression:               - Two 3 to 4 mm polyps in the ascending colon and                            in the cecum, removed with a cold snare. Resected                            and retrieved.                           - The examination was otherwise normal on direct                            and retroflexion views. Recommendation:           - Patient has a contact number available for                            emergencies. The signs and symptoms of potential                            delayed complications were discussed with the                            patient. Return to normal activities tomorrow.                            Written discharge instructions were provided to the                            patient.                            - Resume previous diet.                           - Continue present medications.                           - Await pathology results.                           - Repeat colonoscopy in 5 years for surveillance. Jerene Bears, MD 03/05/2018 9:51:43 AM This report has been signed electronically. CC Letter to:             Ena Dawley, MD

## 2018-03-05 NOTE — Patient Instructions (Signed)
INFORMATION ON POLYPS GIVEN.   YOU HAD AN ENDOSCOPIC PROCEDURE TODAY AT THE  ENDOSCOPY CENTER:   Refer to the procedure report that was given to you for any specific questions about what was found during the examination.  If the procedure report does not answer your questions, please call your gastroenterologist to clarify.  If you requested that your care partner not be given the details of your procedure findings, then the procedure report has been included in a sealed envelope for you to review at your convenience later.  YOU SHOULD EXPECT: Some feelings of bloating in the abdomen. Passage of more gas than usual.  Walking can help get rid of the air that was put into your GI tract during the procedure and reduce the bloating. If you had a lower endoscopy (such as a colonoscopy or flexible sigmoidoscopy) you may notice spotting of blood in your stool or on the toilet paper. If you underwent a bowel prep for your procedure, you may not have a normal bowel movement for a few days.  Please Note:  You might notice some irritation and congestion in your nose or some drainage.  This is from the oxygen used during your procedure.  There is no need for concern and it should clear up in a day or so.  SYMPTOMS TO REPORT IMMEDIATELY:   Following lower endoscopy (colonoscopy or flexible sigmoidoscopy):  Excessive amounts of blood in the stool  Significant tenderness or worsening of abdominal pains  Swelling of the abdomen that is new, acute  Fever of 100F or higher    For urgent or emergent issues, a gastroenterologist can be reached at any hour by calling [phone].   DIET:  We do recommend a small meal at first, but then you may proceed to your regular diet.  Drink plenty of fluids but you should avoid alcoholic beverages for 24 hours.  ACTIVITY:  You should plan to take it easy for the rest of today and you should NOT DRIVE or use heavy machinery until tomorrow (because of the sedation  medicines used during the test).    FOLLOW UP: Our staff will call the number listed on your records the next business day following your procedure to check on you and address any questions or concerns that you may have regarding the information given to you following your procedure. If we do not reach you, we will leave a message.  However, if you are feeling well and you are not experiencing any problems, there is no need to return our call.  We will assume that you have returned to your regular daily activities without incident.  If any biopsies were taken you will be contacted by phone or by letter within the next 1-3 weeks.  Please call us at [phone] if you have not heard about the biopsies in 3 weeks.    SIGNATURES/CONFIDENTIALITY: You and/or your care partner have signed paperwork which will be entered into your electronic medical record.  These signatures attest to the fact that that the information above on your After Visit Summary has been reviewed and is understood.  Full responsibility of the confidentiality of this discharge information lies with you and/or your care-partner. 

## 2018-03-05 NOTE — Progress Notes (Signed)
Called to room to assist during endoscopic procedure.  Patient ID and intended procedure confirmed with present staff. Received instructions for my participation in the procedure from the performing physician.  

## 2018-03-06 ENCOUNTER — Telehealth: Payer: Self-pay

## 2018-03-06 ENCOUNTER — Encounter: Payer: Self-pay | Admitting: General Practice

## 2018-03-06 NOTE — Telephone Encounter (Signed)
  Follow up Call-  Call back number 03/05/2018  Post procedure Call Back phone  # [phone]  Permission to leave phone message Yes  Some recent data might be hidden     Patient questions:  Do you have a fever, pain , or abdominal swelling? No. Pain Score  0 *  Have you tolerated food without any problems? Yes.    Have you been able to return to your normal activities? Yes.    Do you have any questions about your discharge instructions: Diet   No. Medications  No. Follow up visit  No.  Do you have questions or concerns about your Care? No.  Actions: * If pain score is 4 or above: No action needed, pain <4.

## 2018-03-12 ENCOUNTER — Encounter: Payer: Self-pay | Admitting: Internal Medicine

## 2018-05-14 ENCOUNTER — Other Ambulatory Visit: Payer: Self-pay | Admitting: Internal Medicine

## 2018-06-05 ENCOUNTER — Other Ambulatory Visit: Payer: Self-pay | Admitting: Family Medicine

## 2018-06-05 DIAGNOSIS — Z1231 Encounter for screening mammogram for malignant neoplasm of breast: Secondary | ICD-10-CM

## 2018-07-16 ENCOUNTER — Ambulatory Visit: Payer: BLUE CROSS/BLUE SHIELD

## 2018-08-01 ENCOUNTER — Ambulatory Visit: Payer: BLUE CROSS/BLUE SHIELD | Admitting: Internal Medicine

## 2018-08-01 ENCOUNTER — Encounter: Payer: Self-pay | Admitting: Internal Medicine

## 2018-08-01 VITALS — BP 122/80 | HR 78 | Ht 66.0 in | Wt 222.6 lb

## 2018-08-01 DIAGNOSIS — E0500 Thyrotoxicosis with diffuse goiter without thyrotoxic crisis or storm: Secondary | ICD-10-CM | POA: Diagnosis not present

## 2018-08-01 LAB — TSH: TSH: 0.69 u[IU]/mL (ref 0.35–4.50)

## 2018-08-01 LAB — T3, FREE: T3, Free: 3.1 pg/mL (ref 2.3–4.2)

## 2018-08-01 LAB — T4, FREE: Free T4: 0.78 ng/dL (ref 0.60–1.60)

## 2018-08-01 NOTE — Progress Notes (Signed)
Patient ID: Joanne Li, female   DOB: [birth year], 57 y.o.   MRN: [id number]   HPI  Joanne Li is a 57 y.o.-year-old female,returning for f/u for Graves' disease. Last visit 6 months ago.  Reviewed: Thyroid uptake and scan on 02/28/2017 c/w Graves' disease, with an uptake of 49% (10-30%).  We initially started methimazole 5 mg twice daily, but we were able to decrease the dose gradually to 5 mg daily in 10/2017 and she is now on 2.5 mg per day.  TFTs were excellent at last visit: Lab Results  Component Value Date   TSH 2.17 02/01/2018   TSH 5.37 (H) 11/07/2017   TSH 2.06 08/01/2017   TSH <0.01 Repeated and verified X2. (L) 04/21/2017   TSH <0.01 (L) 02/09/2017   TSH 0.02 (L) 11/01/2016   TSH 0.01 (L) 09/23/2016   TSH 0.16 (L) 11/27/2015   TSH 0.11 (L) 10/27/2015   TSH 1.31 12/04/2013   FREET4 0.72 02/01/2018   FREET4 0.62 08/01/2017   FREET4 1.36 04/21/2017   FREET4 1.9 (H) 02/09/2017   FREET4 1.61 (H) 11/27/2015   FREET4 1.75 (H) 10/28/2015   Lab Results  Component Value Date   T3FREE 3.3 02/01/2018   T3FREE 2.4 08/01/2017   T3FREE 4.1 04/21/2017   T3FREE 7.6 (H) 02/09/2017   T3FREE 5.1 (H) 11/27/2015   T3FREE 5.4 (H) 10/28/2015   Her Graves' antibodies were elevated: Lab Results  Component Value Date   TSI 121 02/01/2018   TSI 190 (H) 11/27/2015   Pt denies: - feeling nodules in neck - hoarseness - dysphagia - choking - SOB with lying down  Pt does have a FH of thyroid ds. (sister - hypothyroidism). No FH of thyroid cancer. No h/o radiation tx to head or neck.  No seaweed or kelp. No recent contrast studies. No herbal supplements. No Biotin use. No recent steroids use.   ROS: Constitutional: + weight gain/no weight loss, no fatigue, no subjective hyperthermia, no subjective hypothermia Eyes: no blurry vision, no xerophthalmia ENT: no sore throat, + see HPI Cardiovascular: no CP/no SOB/no palpitations/no leg swelling Respiratory: no cough/no SOB/no  wheezing Gastrointestinal: no N/no V/no D/no C/no acid reflux Musculoskeletal: no muscle aches/no joint aches Skin: no rashes, no hair loss Neurological: no tremors/no numbness/no tingling/no dizziness  I reviewed pt's medications, allergies, PMH, social hx, family hx, and changes were documented in the history of present illness. Otherwise, unchanged from my initial visit note.  Past Medical History:  Diagnosis Date  . Hyperlipidemia   . Insomnia   . MVP (mitral valve prolapse)   . Palpitations   . Sleep walking    Past Surgical History:  Procedure Laterality Date  . COLONOSCOPY  2013  . SHOULDER SURGERY  2009   right   Social History   Social History  . Marital Status: Married    Spouse Name: N/A  . Number of Children: 1   Occupational History  . food Therapist, nutritional    Social History Main Topics  . Smoking status: Never Smoker   . Smokeless tobacco: Never Used  . Alcohol Use: Yes     Comment: occasionally  . Drug Use: No   Current Outpatient Medications on File Prior to Visit  Medication Sig Dispense Refill  . Cholecalciferol (VITAMIN D PO) Take 1 tablet by mouth daily.    Marland Kitchen EPINEPHrine 0.3 mg/0.3 mL IJ SOAJ injection Inject 0.3 mLs (0.3 mg total) into the muscle once. Reported on 04/25/2016 (Patient not taking: Reported on 02/19/2018)  1 Device 3  . methimazole (TAPAZOLE) 5 MG tablet TAKE 1 TABLET TWICE A DAY 90 tablet 1  . Omega-3 Fatty Acids (FISH OIL) 1000 MG CAPS Take 1,000 mg by mouth daily.     Current Facility-Administered Medications on File Prior to Visit  Medication Dose Route Frequency Provider Last Rate Last Dose  . 0.9 %  sodium chloride infusion  500 mL Intravenous Once Pyrtle, Lajuan Lines, MD       Allergies  Allergen Reactions  . Metronidazole     Upset stomach  . Nitrofurantoin     REACTION: sick to stomach  . Wheat Bran Hives    On Body, Face and soles of feet   Family History  Problem Relation Age of Onset  . Arthritis Mother   . Arthritis  Father   . Coronary artery disease Father        s/p quadruple bypass  . Colon cancer Neg Hx   . Esophageal cancer Neg Hx   . Stomach cancer Neg Hx    PE: Ht 5\' 6"  (1.676 m)   Wt 222 lb 9.6 oz (101 kg)   BMI 35.93 kg/m  Body mass index is 35.93 kg/m. Wt Readings from Last 3 Encounters:  08/01/18 222 lb 9.6 oz (101 kg)  03/05/18 216 lb (98 kg)  02/19/18 218 lb (98.9 kg)   Constitutional: overweight, in NAD Eyes: PERRLA, EOMI, no exophthalmos ENT: moist mucous membranes, no thyromegaly, no cervical lymphadenopathy Cardiovascular: RRR, No MRG Respiratory: CTA B Gastrointestinal: abdomen soft, NT, ND, BS+ Musculoskeletal: no deformities, strength intact in all 4 Skin: moist, warm, no rashes Neurological: no tremor with outstretched hands, DTR normal in all 4   ASSESSMENT: 1. Graves ds.  PLAN:  1. Patient with history of a mildly low TSH high free T4, confirmed this Graves' disease per uptake and scan performed in 02/2017.  She also had elevated TSI antibodies, which have normalized at last visit.  She did not have significant hyperthyroid symptoms except for hot flashes, which could be menopausal.  We initially started methimazole 5 mg twice a day and her TFTs improved.  We continue to adjust the dose of methimazole, and she is currently on 5 mg every other day.  She has no side effects from the medication. -We again discussed about other modalities of treatment for Graves' disease, RAI treatment or surgery, however, I do not feel that any of these are needed for now, since she is responding so well to methimazole.  New studies show that a low-dose methimazole is safe and effective in preventing Graves' disease recurrences.  We also discussed about the possibility of coming off methimazole soon and also of developing hypothyroidism -No need to start a beta-blocker for now since she is not tachycardic or tremulous -We will recheck her TFTs today and adjust the methimazole dose  accordingly -I will see her back in a year for a visit but in 6 months for thyroid labs  - time spent with the patient: 15 min of which >50% was spent in obtaining information about her symptoms, reviewing her previous labs, evaluations, and treatments, counseling her about her condition (please see the discussed topics above), and developing a plan to further investigate and treat it; she had a number of questions, especially Re: weight gain, which I addressed.  Office Visit on 08/01/2018  Component Date Value Ref Range Status  . TSH 08/01/2018 0.69  0.35 - 4.50 uIU/mL Final  . T3, Free 08/01/2018 3.1  2.3 -  4.2 pg/mL Final  . Free T4 08/01/2018 0.78  0.60 - 1.60 ng/dL Final   Comment: Specimens from patients who are undergoing biotin therapy and /or ingesting biotin supplements may contain high levels of biotin.  The higher biotin concentration in these specimens interferes with this Free T4 assay.  Specimens that contain high levels  of biotin may cause false high results for this Free T4 assay.  Please interpret results in light of the total clinical presentation of the patient.     Thyroid tests are normal.  For now, I would suggest to continue the current dose of methimazole.  Philemon Kingdom, MD PhD [facility] Endocrinology

## 2018-08-01 NOTE — Patient Instructions (Addendum)
Please stop at the lab.  Please continue methimazole 5 mg every other day for now.  Please return to see me in a year, but in 6 months for labs.

## 2018-08-09 ENCOUNTER — Ambulatory Visit
Admission: RE | Admit: 2018-08-09 | Discharge: 2018-08-09 | Disposition: A | Discharge status: Home / Self Care | Payer: BLUE CROSS/BLUE SHIELD | Source: Ambulatory Visit | Attending: Family Medicine | Admitting: Family Medicine

## 2018-08-09 DIAGNOSIS — Z1231 Encounter for screening mammogram for malignant neoplasm of breast: Secondary | ICD-10-CM

## 2018-09-03 ENCOUNTER — Other Ambulatory Visit: Payer: Self-pay

## 2018-09-03 ENCOUNTER — Ambulatory Visit: Payer: BLUE CROSS/BLUE SHIELD | Admitting: Family Medicine

## 2018-09-03 ENCOUNTER — Encounter: Payer: Self-pay | Admitting: Family Medicine

## 2018-09-03 ENCOUNTER — Encounter: Payer: Self-pay | Admitting: Emergency Medicine

## 2018-09-03 VITALS — BP 112/72 | HR 78 | Temp 98.6°F | Ht 66.0 in | Wt 221.6 lb

## 2018-09-03 DIAGNOSIS — M546 Pain in thoracic spine: Secondary | ICD-10-CM | POA: Diagnosis not present

## 2018-09-03 DIAGNOSIS — M62838 Other muscle spasm: Secondary | ICD-10-CM

## 2018-09-03 MED ORDER — CYCLOBENZAPRINE HCL 10 MG PO TABS: 10.0000 mg | ORAL_TABLET | Freq: Three times a day (TID) | ORAL | 0 refills | Status: DC | PRN

## 2018-09-03 NOTE — Patient Instructions (Signed)
Please follow up if symptoms do not improve or as needed.    Back Pain, Adult Many adults have back pain from time to time. Common causes of back pain include:  A strained muscle or ligament.  Wear and tear (degeneration) of the spinal disks.  Arthritis.  A hit to the back.  Back pain can be short-lived (acute) or last a long time (chronic). A physical exam, lab tests, and imaging studies may be done to find the cause of your pain. Follow these instructions at home: Managing pain and stiffness  Take over-the-counter and prescription medicines only as told by your health care provider.  If directed, apply heat to the affected area as often as told by your health care provider. Use the heat source that your health care provider recommends, such as a moist heat pack or a heating pad. ? Place a towel between your skin and the heat source. ? Leave the heat on for 20-30 minutes. ? Remove the heat if your skin turns bright red. This is especially important if you are unable to feel pain, heat, or cold. You have a greater risk of getting burned.  If directed, apply ice to the injured area: ? Put ice in a plastic bag. ? Place a towel between your skin and the bag. ? Leave the ice on for 20 minutes, 2-3 times a day for the first 2-3 days. Activity  Do not stay in bed. Resting more than 1-2 days can delay your recovery.  Take short walks on even surfaces as soon as you are able. Try to increase the length of time you walk each day.  Do not sit, drive, or stand in one place for more than 30 minutes at a time. Sitting or standing for long periods of time can put stress on your back.  Use proper lifting techniques. When you bend and lift, use positions that put less stress on your back: ? [place] your knees. ? Keep the load close to your body. ? Avoid twisting.  Exercise regularly as told by your health care provider. Exercising will help your back heal faster. This also helps prevent back  injuries by keeping muscles strong and flexible.  Your health care provider may recommend that you see a physical therapist. This person can help you come up with a safe exercise program. Do any exercises as told by your physical therapist. Lifestyle  Maintain a healthy weight. Extra weight puts stress on your back and makes it difficult to have good posture.  Avoid activities or situations that make you feel anxious or stressed. Learn ways to manage anxiety and stress. One way to manage stress is through exercise. Stress and anxiety increase muscle tension and can make back pain worse. General instructions  Sleep on a firm mattress in a comfortable position. Try lying on your side with your knees slightly bent. If you lie on your back, put a pillow under your knees.  Follow your treatment plan as told by your health care provider. This may include: ? Cognitive or behavioral therapy. ? Acupuncture or massage therapy. ? Meditation or yoga. Contact a health care provider if:  You have pain that is not relieved with rest or medicine.  You have increasing pain going down into your legs or buttocks.  Your pain does not improve in 2 weeks.  You have pain at night.  You lose weight.  You have a fever or chills. Get help right away if:  You develop new bowel  or bladder control problems.  You have unusual weakness or numbness in your arms or legs.  You develop nausea or vomiting.  You develop abdominal pain.  You feel faint. Summary  Many adults have back pain from time to time. A physical exam, lab tests, and imaging studies may be done to find the cause of your pain.  Use proper lifting techniques. When you bend and lift, use positions that put less stress on your back.  Take over-the-counter and prescription medicines and apply heat or ice as directed by your health care provider. This information is not intended to replace advice given to you by your health care provider.  Make sure you discuss any questions you have with your health care provider. Document Released: 11/28/2005 Document Revised: 01/02/2017 Document Reviewed: 01/02/2017 Elsevier Interactive Patient Education  [employer].

## 2018-09-03 NOTE — Progress Notes (Signed)
Subjective  CC:  Chief Complaint  Patient presents with  . Back Pain    lower back pain since Friday, she states she starting feeling stiff, after picking up boxes at work     HPI: Joanne Li is a 57 y.o. female who presents to the office today to address the problems listed above in the chief complaint.  57 year old presents with 24-hour history of back pain, mainly related to right-sided thoracic back spasm, muscle spasm.  Reports she lifted boxes at work on Friday but did not really feel any pain.  And she traveled to [place] and was standing and cooking and grilling out all day long in the heat and a family reunion.  Right sided back pain with muscle spasms was moderate to severe.  She took her husband's Flexeril.  Today she is feeling much better.  She reports pain at 12% out of 100%.  No radicular symptoms.  No leg pain.  No weakness.  No bowel or bladder problems.  She has no chronic back issues.  No trauma.  She has not taken anything for pain. Assessment  1. Acute right-sided thoracic back pain   2. Muscle spasm      Plan   Acute right-sided back pain, musculoskeletal: Start NSAIDs, over-the-counter Aleve twice daily and add muscle relaxer and warm heat compresses.  Stretching discussed.  No red flags identified.  Improving  Work note provided today, out of work today.  Follow up: As needed or if worsens  No orders of the defined types were placed in this encounter.  Meds ordered this encounter  Medications  . cyclobenzaprine (FLEXERIL) 10 MG tablet    Sig: Take 1 tablet (10 mg total) by mouth 3 (three) times daily as needed for muscle spasms.    Dispense:  30 tablet    Refill:  0      I reviewed the patients updated PMH, FH, and SocHx.    Patient Active Problem List   Diagnosis Date Noted  . Graves disease 11/27/2015  . 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 Palpitations 12/04/2013  . Physical exam 07/25/2011  . Screening for malignant neoplasm of the  cervix 07/25/2011  . Disordered sleep 07/25/2011  . Colon cancer screening 07/25/2011  . Hyperlipidemia 08/27/2010  . UNSPECIFIED HEARING LOSS 07/23/2010  . GERD 07/23/2010  . Anxiety state 05/25/2009  . 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 ROTATOR CUFF SYNDROME, RIGHT 01/29/2008   Current Meds  Medication Sig  . methimazole (TAPAZOLE) 5 MG tablet TAKE 1 TABLET TWICE A DAY  . Omega-3 Fatty Acids (FISH OIL) 1000 MG CAPS Take 1,000 mg by mouth daily.    Allergies: Patient is allergic to metronidazole; nitrofurantoin; and wheat bran. Family History: Patient family history includes Arthritis in her father and mother; Coronary artery disease in her father. Social History:  Patient  reports that she has never smoked. She has never used smokeless tobacco. She reports that she drinks alcohol. She reports that she does not use drugs.  Review of Systems: Constitutional: Negative for fever malaise or anorexia Cardiovascular: negative for chest pain Respiratory: negative for SOB or persistent cough Gastrointestinal: negative for abdominal pain  Objective  Vitals: BP 112/72   Pulse 78   Temp 98.6 F (37 C)   Ht 5\' 6"  (1.676 m)   Wt 221 lb 9.6 oz (100.5 kg)   SpO2 98%   BMI 35.77 kg/m  General: no acute distress , A&Ox3 HEENT: PEERL,  conjunctiva normal, Oropharynx moist,neck is supple Cardiovascular:  RRR without murmur or gallop.  Respiratory:  Good breath sounds bilaterally, CTAB with normal respiratory effort Skin:  Warm, no rashes Back Exam - Normal skin, Spine with normal alignment and without deformity.  No tenderness to vertebral process palpation.  Paraspinous muscles are not tender and without spasm.   Range of motion is full at neck and lumbosacral regions       Commons side effects, risks, benefits, and alternatives for medications and treatment plan prescribed today were discussed, and the patient expressed understanding of  the given instructions. Patient is instructed to call or message via MyChart if he/she has any questions or concerns regarding our treatment plan. No barriers to understanding were identified. We discussed Red Flag symptoms and signs in detail. Patient expressed understanding regarding what to do in case of urgent or emergency type symptoms.   Medication list was reconciled, printed and provided to the patient in AVS. Patient instructions and summary information was reviewed with the patient as documented in the AVS. This note was prepared with assistance of Dragon voice recognition software. Occasional wrong-word or sound-a-like substitutions may have occurred due to the inherent limitations of voice recognition software

## 2018-12-04 ENCOUNTER — Encounter: Payer: BLUE CROSS/BLUE SHIELD | Admitting: Family Medicine

## 2018-12-14 ENCOUNTER — Other Ambulatory Visit: Payer: Self-pay

## 2018-12-14 ENCOUNTER — Ambulatory Visit (INDEPENDENT_AMBULATORY_CARE_PROVIDER_SITE_OTHER): Payer: BLUE CROSS/BLUE SHIELD | Admitting: Family Medicine

## 2018-12-14 ENCOUNTER — Encounter: Payer: Self-pay | Admitting: Family Medicine

## 2018-12-14 VITALS — BP 124/83 | HR 80 | Temp 98.5°F | Resp 16 | Ht 66.0 in | Wt 223.4 lb

## 2018-12-14 DIAGNOSIS — E669 Obesity, unspecified: Secondary | ICD-10-CM

## 2018-12-14 DIAGNOSIS — Z0000 Encounter for general adult medical examination without abnormal findings: Secondary | ICD-10-CM

## 2018-12-14 DIAGNOSIS — F411 Generalized anxiety disorder: Secondary | ICD-10-CM

## 2018-12-14 DIAGNOSIS — E559 Vitamin D deficiency, unspecified: Secondary | ICD-10-CM | POA: Diagnosis not present

## 2018-12-14 MED ORDER — ALPRAZOLAM 0.5 MG PO TABS: 0.5000 mg | ORAL_TABLET | Freq: Two times a day (BID) | ORAL | 0 refills | Status: DC | PRN

## 2018-12-14 MED ORDER — PROMETHAZINE-DM 6.25-15 MG/5ML PO SYRP: 5.0000 mL | ORAL_SOLUTION | Freq: Four times a day (QID) | ORAL | 0 refills | Status: DC | PRN

## 2018-12-14 NOTE — Assessment & Plan Note (Signed)
Pt w/ hx of this.  Check labs and replete prn.

## 2018-12-14 NOTE — Patient Instructions (Signed)
Follow up in 1 year or as needed We'll notify you of your lab results and make any changes if needed Continue to work on healthy diet and regular exercise- you can do it! Use the Alprazolam as needed for those high stressed or panicked moments Use the cough syrup as needed- may cause drowsiness Continue the Mucinex for cough/congestion Make sure you are taking a daily allergy medication to dry up the drainage Call with any questions or concerns Happy New Year!!!

## 2018-12-14 NOTE — Progress Notes (Signed)
   Subjective:    Patient ID: Joanne Li, female    DOB: [birth year], 58 y.o.   MRN: [id number]  HPI CPE- UTD on pap, mammo, colonoscopy.  UTD on Tdap, declines flu.   Review of Systems Patient reports no vision/ hearing changes, adenopathy,fever, weight change,  persistant/recurrent hoarseness , swallowing issues, chest pain, palpitations, edema, persistant/recurrent cough, hemoptysis, dyspnea (rest/exertional/paroxysmal nocturnal), gastrointestinal bleeding (melena, rectal bleeding), abdominal pain, significant heartburn, bowel changes, GU symptoms (dysuria, hematuria, incontinence), Gyn symptoms (abnormal  bleeding, pain),  syncope, focal weakness, memory loss, numbness & tingling, skin/hair/nail changes, abnormal bruising or bleeding.   + anxiety- pt asking for refill on Alprazolam    Objective:   Physical Exam General Appearance:    Alert, cooperative, no distress, appears stated age, obese  Head:    Normocephalic, without obvious abnormality, atraumatic  Eyes:    PERRL, conjunctiva/corneas clear, EOM's intact, fundi    benign, both eyes  Ears:    Normal TM's and external ear canals, both ears  Nose:   Nares normal, septum midline, mucosa normal, no drainage    or sinus tenderness  Throat:   Lips, mucosa, and tongue normal; teeth and gums normal  Neck:   Supple, symmetrical, trachea midline, no adenopathy;    Thyroid: no enlargement/tenderness/nodules  Back:     Symmetric, no curvature, ROM normal, no CVA tenderness  Lungs:     Clear to auscultation bilaterally, respirations unlabored  Chest Wall:    No tenderness or deformity   Heart:    Regular rate and rhythm, S1 and S2 normal, no murmur, rub   or gallop  Breast Exam:    Deferred to GYN  Abdomen:     Soft, non-tender, bowel sounds active all four quadrants,    no masses, no organomegaly  Genitalia:    Deferred to GYN  Rectal:    Extremities:   Extremities normal, atraumatic, no cyanosis or edema  Pulses:   2+ and  symmetric all extremities  Skin:   Skin color, texture, turgor normal, no rashes or lesions  Lymph nodes:   Cervical, supraclavicular, and axillary nodes normal  Neurologic:   CNII-XII intact, normal strength, sensation and reflexes    throughout          Assessment & Plan:

## 2018-12-14 NOTE — Assessment & Plan Note (Signed)
Ongoing issue for pt.  Stressed need for healthy diet and regular exercise.  Check labs to risk stratify. 

## 2018-12-14 NOTE — Assessment & Plan Note (Signed)
Pt's PE WNL w/ exception of obesity.  UTD on pap, mammo, colonoscopy, Tdap.  Declines flu.  Check labs.  Anticipatory guidance provided.

## 2018-12-14 NOTE — Assessment & Plan Note (Signed)
Deteriorated.  Pt is again struggling w/ keeping her cool at work.  Asking for refill of Alprazolam to use as needed.  Refill provided.  Will follow.

## 2018-12-15 LAB — CBC WITH DIFFERENTIAL/PLATELET
ABSOLUTE MONOCYTES: 621 {cells}/uL (ref 200–950)
Basophils Absolute: 48 cells/uL (ref 0–200)
Basophils Relative: 0.7 %
Eosinophils Absolute: 173 cells/uL (ref 15–500)
Eosinophils Relative: 2.5 %
HEMATOCRIT: 42.4 % (ref 35.0–45.0)
Hemoglobin: 14 g/dL (ref 11.7–15.5)
LYMPHS ABS: 2387 {cells}/uL (ref 850–3900)
MCH: 27.8 pg (ref 27.0–33.0)
MCHC: 33 g/dL (ref 32.0–36.0)
MCV: 84.3 fL (ref 80.0–100.0)
MPV: 11.5 fL (ref 7.5–12.5)
Monocytes Relative: 9 %
NEUTROS ABS: 3671 {cells}/uL (ref 1500–7800)
NEUTROS PCT: 53.2 %
PLATELETS: 291 10*3/uL (ref 140–400)
RBC: 5.03 10*6/uL (ref 3.80–5.10)
RDW: 13.8 % (ref 11.0–15.0)
Total Lymphocyte: 34.6 %
WBC: 6.9 10*3/uL (ref 3.8–10.8)

## 2018-12-15 LAB — LIPID PANEL
CHOL/HDL RATIO: 4.2 (calc) (ref <5.0)
Cholesterol: 262 mg/dL — ABNORMAL HIGH (ref <200)
HDL: 63 mg/dL (ref >50)
LDL Cholesterol (Calc): 177 mg/dL (calc) — ABNORMAL HIGH
Non-HDL Cholesterol (Calc): 199 mg/dL (calc) — ABNORMAL HIGH (ref <130)
TRIGLYCERIDES: 101 mg/dL (ref <150)

## 2018-12-15 LAB — BASIC METABOLIC PANEL
BUN: 11 mg/dL (ref 7–25)
CO2: 26 mmol/L (ref 20–32)
Calcium: 9.4 mg/dL (ref 8.6–10.4)
Chloride: 101 mmol/L (ref 98–110)
Creat: 0.73 mg/dL (ref 0.50–1.05)
Glucose, Bld: 87 mg/dL (ref 65–99)
POTASSIUM: 3.9 mmol/L (ref 3.5–5.3)
SODIUM: 139 mmol/L (ref 135–146)

## 2018-12-15 LAB — HEPATIC FUNCTION PANEL
AG Ratio: 1.5 (calc) (ref 1.0–2.5)
ALBUMIN MSPROF: 4.2 g/dL (ref 3.6–5.1)
ALKALINE PHOSPHATASE (APISO): 70 U/L (ref 33–130)
ALT: 17 U/L (ref 6–29)
AST: 17 U/L (ref 10–35)
BILIRUBIN DIRECT: 0.1 mg/dL (ref 0.0–0.2)
BILIRUBIN TOTAL: 0.2 mg/dL (ref 0.2–1.2)
Globulin: 2.8 g/dL (calc) (ref 1.9–3.7)
Indirect Bilirubin: 0.1 mg/dL (calc) — ABNORMAL LOW (ref 0.2–1.2)
TOTAL PROTEIN: 7 g/dL (ref 6.1–8.1)

## 2018-12-15 LAB — VITAMIN D 25 HYDROXY (VIT D DEFICIENCY, FRACTURES): Vit D, 25-Hydroxy: 23 ng/mL — ABNORMAL LOW (ref 30–100)

## 2018-12-15 LAB — TSH: TSH: 1.29 m[IU]/L (ref 0.40–4.50)

## 2018-12-17 ENCOUNTER — Other Ambulatory Visit: Payer: Self-pay | Admitting: General Practice

## 2018-12-17 ENCOUNTER — Encounter: Payer: Self-pay | Admitting: Family Medicine

## 2018-12-17 DIAGNOSIS — E785 Hyperlipidemia, unspecified: Secondary | ICD-10-CM

## 2018-12-17 MED ORDER — VITAMIN D (ERGOCALCIFEROL) 1.25 MG (50000 UNIT) PO CAPS: 50000.0000 [IU] | ORAL_CAPSULE | ORAL | 0 refills | Status: DC

## 2018-12-17 MED ORDER — ROSUVASTATIN CALCIUM 20 MG PO TABS: 20.0000 mg | ORAL_TABLET | Freq: Every day | ORAL | 6 refills | Status: DC

## 2018-12-26 ENCOUNTER — Other Ambulatory Visit: Payer: Self-pay | Admitting: Family Medicine

## 2018-12-26 MED ORDER — ROSUVASTATIN CALCIUM 20 MG PO TABS: 20.0000 mg | ORAL_TABLET | Freq: Every day | ORAL | 0 refills | Status: DC

## 2018-12-26 NOTE — Telephone Encounter (Signed)
Copied from [place] [phone]. Topic: Quick Communication - Rx Refill/Question >> Dec 26, 2018  8:46 AM Keene Breath wrote: Medication: rosuvastatin (CRESTOR) 20 MG tablet  Patient called to request a refill for the above medication  Preferred Pharmacy (with phone number or street name): [facility] [place], [place] [phone] (Phone) [phone] (Fax)

## 2019-01-29 ENCOUNTER — Telehealth: Payer: Self-pay | Admitting: Internal Medicine

## 2019-01-29 NOTE — Telephone Encounter (Signed)
Notified patient of message from Dr. Cruzita Lederer, patient expressed understanding and agreement.  Patient is wondering if Dr. Cruzita Lederer could review the labs and send her a MyChart message?

## 2019-01-29 NOTE — Telephone Encounter (Signed)
Yes please

## 2019-01-29 NOTE — Telephone Encounter (Signed)
Would she still want me to send her a MyChart msg? TSH was normal..Marland Kitchen

## 2019-01-29 NOTE — Telephone Encounter (Signed)
Yes, that would be fine.  We do not need to get additional labs.

## 2019-01-29 NOTE — Telephone Encounter (Signed)
Patient called to ask if the lab work she had done at Dr Virgil Benedict office on 12/14/2018 would be sufficient enough for her not to have her lab work pulled her on Friday 02/01/2019.   Please review and advise patient at [phone]

## 2019-02-01 ENCOUNTER — Other Ambulatory Visit: Payer: BLUE CROSS/BLUE SHIELD

## 2019-02-11 ENCOUNTER — Other Ambulatory Visit (INDEPENDENT_AMBULATORY_CARE_PROVIDER_SITE_OTHER): Payer: BLUE CROSS/BLUE SHIELD

## 2019-02-11 DIAGNOSIS — E785 Hyperlipidemia, unspecified: Secondary | ICD-10-CM

## 2019-02-12 LAB — HEPATIC FUNCTION PANEL
ALT: 14 U/L (ref 0–35)
AST: 15 U/L (ref 0–37)
Albumin: 4.2 g/dL (ref 3.5–5.2)
Alkaline Phosphatase: 60 U/L (ref 39–117)
Bilirubin, Direct: 0.1 mg/dL (ref 0.0–0.3)
TOTAL PROTEIN: 6.7 g/dL (ref 6.0–8.3)
Total Bilirubin: 0.3 mg/dL (ref 0.2–1.2)

## 2019-03-27 ENCOUNTER — Other Ambulatory Visit: Payer: Self-pay | Admitting: Family Medicine

## 2019-07-02 ENCOUNTER — Other Ambulatory Visit: Payer: Self-pay | Admitting: Family Medicine

## 2019-07-02 DIAGNOSIS — Z1231 Encounter for screening mammogram for malignant neoplasm of breast: Secondary | ICD-10-CM

## 2019-07-22 ENCOUNTER — Other Ambulatory Visit: Payer: Self-pay | Admitting: Family Medicine

## 2019-07-22 NOTE — Telephone Encounter (Signed)
Last OV 12/14/18 Alprazolam last filled 12/14/18 #60 with 0

## 2019-07-29 ENCOUNTER — Encounter: Payer: Self-pay | Admitting: Internal Medicine

## 2019-07-29 ENCOUNTER — Ambulatory Visit: Payer: BC Managed Care – PPO | Admitting: Internal Medicine

## 2019-07-29 ENCOUNTER — Other Ambulatory Visit: Payer: Self-pay

## 2019-07-29 VITALS — BP 120/70 | HR 74 | Ht 66.0 in | Wt 225.0 lb

## 2019-07-29 DIAGNOSIS — E0500 Thyrotoxicosis with diffuse goiter without thyrotoxic crisis or storm: Secondary | ICD-10-CM | POA: Diagnosis not present

## 2019-07-29 LAB — TSH: TSH: 1.32 u[IU]/mL (ref 0.35–4.50)

## 2019-07-29 LAB — T3, FREE: T3, Free: 3 pg/mL (ref 2.3–4.2)

## 2019-07-29 LAB — T4, FREE: Free T4: 0.88 ng/dL (ref 0.60–1.60)

## 2019-07-29 MED ORDER — METHIMAZOLE 5 MG PO TABS: ORAL_TABLET | ORAL | 2 refills | Status: DC

## 2019-07-29 NOTE — Patient Instructions (Addendum)
Please continue Methimazole 5 mg 2x a week for now.  Please stop at the lab.  Please come back for labs in 6 months and for a visit in 1 year.

## 2019-07-29 NOTE — Progress Notes (Signed)
Patient ID: Joanne Li, female   DOB: 16-Dec-1960, 58 y.o.   MRN: [id number]   HPI  Joanne Li is a 58 y.o.-year-old female,returning for f/u for Graves' disease. Last visit 1 year ago.  Reviewed previous studies: Thyroid uptake and scan on 02/28/2017 c/w Graves' disease, with an uptake of 49% (10-30%).  We initially started methimazole 5 mg twice daily, but we were able to decrease the dose gradually to 5 mg daily in 10/2017 and was on 2.5 mg/day >> now 5 mg 2x a week.  TFTs were excellent at last check: Lab Results  Component Value Date   TSH 1.29 12/14/2018   TSH 0.69 08/01/2018   TSH 2.17 02/01/2018   TSH 5.37 (H) 11/07/2017   TSH 2.06 08/01/2017   TSH <0.01 Repeated and verified X2. (L) 04/21/2017   TSH <0.01 (L) 02/09/2017   TSH 0.02 (L) 11/01/2016   TSH 0.01 (L) 09/23/2016   TSH 0.16 (L) 11/27/2015   FREET4 0.78 08/01/2018   FREET4 0.72 02/01/2018   FREET4 0.62 08/01/2017   FREET4 1.36 04/21/2017   FREET4 1.9 (H) 02/09/2017   FREET4 1.61 (H) 11/27/2015   FREET4 1.75 (H) 10/28/2015   Lab Results  Component Value Date   T3FREE 3.1 08/01/2018   T3FREE 3.3 02/01/2018   T3FREE 2.4 08/01/2017   T3FREE 4.1 04/21/2017   T3FREE 7.6 (H) 02/09/2017   T3FREE 5.1 (H) 11/27/2015   T3FREE 5.4 (H) 10/28/2015   Her Graves' antibodies are elevated but decreased to normal at last check: Lab Results  Component Value Date   TSI 121 02/01/2018   TSI 190 (H) 11/27/2015   Pt denies: - weight loss - heat intolerance - tremors - palpitations - anxiety - hyperdefecation - hair loss  Pt denies: - feeling nodules in neck - hoarseness - dysphagia - SOB with lying down But describes choking at waking up.  Pt does have a FH of thyroid ds. (sister - hypothyroidism). No FH of thyroid cancer. No h/o radiation tx to head or neck.  No seaweed or kelp. No recent contrast studies. No herbal supplements. No Biotin use. No recent steroids use.   ROS: Constitutional: no weight  gain/no weight loss, no fatigue, no subjective hyperthermia, no subjective hypothermia Eyes: no blurry vision, no xerophthalmia ENT: no sore throat, + see HPI Cardiovascular: no CP/no SOB/no palpitations/no leg swelling Respiratory: no cough/no SOB/no wheezing Gastrointestinal: no N/no V/no D/no C/no acid reflux Musculoskeletal: no muscle aches/no joint aches Skin: no rashes, no hair loss Neurological: no tremors/no numbness/no tingling/no dizziness  I reviewed pt's medications, allergies, PMH, social hx, family hx, and changes were documented in the history of present illness. Otherwise, unchanged from my initial visit note.  Past Medical History:  Diagnosis Date  . Hyperlipidemia   . Insomnia   . MVP (mitral valve prolapse)   . Palpitations   . Sleep walking    Past Surgical History:  Procedure Laterality Date  . COLONOSCOPY  2013  . SHOULDER SURGERY  2009   right   Social History   Social History  . Marital Status: Married    Spouse Name: N/A  . Number of Children: 1   Occupational History  . food Therapist, nutritional    Social History Main Topics  . Smoking status: Never Smoker   . Smokeless tobacco: Never Used  . Alcohol Use: Yes     Comment: occasionally  . Drug Use: No   Current Outpatient Medications on File Prior to Visit  Medication  Sig Dispense Refill  . ALPRAZolam (XANAX) 0.25 MG tablet TAKE 2 TABLETS TWICE A DAY AS NEEDED FOR ANXIETY 90 tablet 1  . cyclobenzaprine (FLEXERIL) 10 MG tablet Take 1 tablet (10 mg total) by mouth 3 (three) times daily as needed for muscle spasms. 30 tablet 0  . EPINEPHrine 0.3 mg/0.3 mL IJ SOAJ injection Inject 0.3 mLs (0.3 mg total) into the muscle once. Reported on 04/25/2016 (Patient not taking: Reported on 12/14/2018) 1 Device 3  . methimazole (TAPAZOLE) 5 MG tablet TAKE 1 TABLET TWICE A DAY 90 tablet 1  . Omega-3 Fatty Acids (FISH OIL) 1000 MG CAPS Take 1,000 mg by mouth daily.    . promethazine-dextromethorphan (PROMETHAZINE-DM)  6.25-15 MG/5ML syrup Take 5 mLs by mouth 4 (four) times daily as needed. 120 mL 0  . rosuvastatin (CRESTOR) 20 MG tablet TAKE 1 TABLET DAILY 90 tablet 3  . Vitamin D, Ergocalciferol, (DRISDOL) 1.25 MG (50000 UT) CAPS capsule Take 1 capsule (50,000 Units total) by mouth every 7 (seven) days. 12 capsule 0   No current facility-administered medications on file prior to visit.    Allergies  Allergen Reactions  . Metronidazole     Upset stomach  . Nitrofurantoin     REACTION: sick to stomach  . Wheat Bran Hives    On Body, Face and soles of feet   Family History  Problem Relation Age of Onset  . Arthritis Mother   . Arthritis Father   . Coronary artery disease Father        s/p quadruple bypass  . Colon cancer Neg Hx   . Esophageal cancer Neg Hx   . Stomach cancer Neg Hx    PE: BP 120/70   Pulse 74   Ht 5\' 6"  (1.676 m)   Wt 225 lb (102.1 kg)   SpO2 98%   BMI 36.32 kg/m  Body mass index is 36.32 kg/m. Wt Readings from Last 3 Encounters:  07/29/19 225 lb (102.1 kg)  12/14/18 223 lb 6 oz (101.3 kg)  09/03/18 221 lb 9.6 oz (100.5 kg)   Constitutional: overweight, in NAD Eyes: PERRLA, EOMI, no exophthalmos ENT: moist mucous membranes, no thyromegaly, no cervical lymphadenopathy Cardiovascular: RRR, No MRG Respiratory: CTA B Gastrointestinal: abdomen soft, NT, ND, BS+ Musculoskeletal: no deformities, strength intact in all 4 Skin: moist, warm, no rashes Neurological: no tremor with outstretched hands, DTR normal in all 4  ASSESSMENT: 1. Graves ds.  PLAN:  1. Patient with history of mildly low TSH and high free T4, confirmed as Graves' disease per uptake and scan from 02/2017.  She also had elevated TSI antibodies, which normalized at last check.  She did not have significant hyperthyroid symptoms except for hot flashes, which could be postmenopausal.  We initially started methimazole 5 mg twice a day and her TFTs improved.  We continued to adjust the dose of methimazole and  at last visit, we continue it 2.5 mg daily (5 mg every other day).  At this visit, she tells me that she is taking 5 mg twice a week only.  No side effects from the medication.  She responded very well to methimazole and we decided to continue.  We did discuss about other options for treatment: RAI treatment or surgery, however, since she was doing well on a very low dose of methimazole, I am hoping that we can stop this soon. -Reviewed together latest TFTs, which were normal in 12/2018. -At this visit, she is asymptomatic and she does not have  tachycardia.  There is no need to start a beta-blocker -We will recheck her TFTs today and adjust the methimazole dose accordingly.  I will also check a TSI antibodies since we are contemplating stopping methimazole.  I would like to make sure these are normal before actually stopping the medication. -I will see her back in a year but in 6 months for labs  - time spent with the patient: 15 min, of which >50% was spent in obtaining information about her symptoms, reviewing her previous labs, evaluations, and treatments, counseling her about her condition (please see the discussed topics above), and developing a plan to further investigate and treat it; she had a number of questions which I addressed.  Philemon Kingdom, MD PhD [facility] Endocrinology

## 2019-07-30 ENCOUNTER — Ambulatory Visit: Payer: BLUE CROSS/BLUE SHIELD | Admitting: Internal Medicine

## 2019-07-31 ENCOUNTER — Other Ambulatory Visit: Payer: Self-pay | Admitting: Internal Medicine

## 2019-07-31 DIAGNOSIS — E0500 Thyrotoxicosis with diffuse goiter without thyrotoxic crisis or storm: Secondary | ICD-10-CM

## 2019-07-31 LAB — THYROID STIMULATING IMMUNOGLOBULIN: TSI: <89 % baseline (ref <140)

## 2019-08-16 ENCOUNTER — Ambulatory Visit: Payer: BLUE CROSS/BLUE SHIELD

## 2019-08-20 ENCOUNTER — Other Ambulatory Visit: Payer: Self-pay

## 2019-08-20 ENCOUNTER — Ambulatory Visit
Admission: RE | Admit: 2019-08-20 | Discharge: 2019-08-20 | Disposition: A | Discharge status: Home / Self Care | Payer: BC Managed Care – PPO | Source: Ambulatory Visit | Attending: Family Medicine | Admitting: Family Medicine

## 2019-08-20 DIAGNOSIS — Z1231 Encounter for screening mammogram for malignant neoplasm of breast: Secondary | ICD-10-CM

## 2019-12-02 ENCOUNTER — Encounter: Payer: Self-pay | Admitting: Podiatry

## 2019-12-02 ENCOUNTER — Other Ambulatory Visit: Payer: Self-pay

## 2019-12-02 ENCOUNTER — Ambulatory Visit (INDEPENDENT_AMBULATORY_CARE_PROVIDER_SITE_OTHER): Payer: BC Managed Care – PPO | Admitting: Podiatry

## 2019-12-02 DIAGNOSIS — B079 Viral wart, unspecified: Secondary | ICD-10-CM | POA: Diagnosis not present

## 2019-12-02 DIAGNOSIS — M779 Enthesopathy, unspecified: Secondary | ICD-10-CM

## 2019-12-02 DIAGNOSIS — M7752 Other enthesopathy of left foot: Secondary | ICD-10-CM | POA: Diagnosis not present

## 2019-12-03 NOTE — Progress Notes (Signed)
Subjective:   Patient ID: Joanne Li, female   DOB: 58 y.o.   MRN: [id number]   HPI Patient states she is had a lot of pain in the fifth metatarsal of both feet and its been going on for a fairly long time.  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

## 2019-12-16 ENCOUNTER — Encounter: Payer: BC Managed Care – PPO | Admitting: Family Medicine

## 2020-01-24 ENCOUNTER — Other Ambulatory Visit: Payer: Self-pay

## 2020-01-24 ENCOUNTER — Encounter: Payer: Self-pay | Admitting: Family Medicine

## 2020-01-24 ENCOUNTER — Ambulatory Visit (INDEPENDENT_AMBULATORY_CARE_PROVIDER_SITE_OTHER): Payer: BC Managed Care – PPO | Admitting: Family Medicine

## 2020-01-24 VITALS — BP 128/78 | HR 76 | Temp 97.9°F | Resp 16 | Ht 66.0 in | Wt 212.4 lb

## 2020-01-24 DIAGNOSIS — E559 Vitamin D deficiency, unspecified: Secondary | ICD-10-CM | POA: Diagnosis not present

## 2020-01-24 DIAGNOSIS — Z0000 Encounter for general adult medical examination without abnormal findings: Secondary | ICD-10-CM | POA: Diagnosis not present

## 2020-01-24 DIAGNOSIS — E669 Obesity, unspecified: Secondary | ICD-10-CM

## 2020-01-24 DIAGNOSIS — E0500 Thyrotoxicosis with diffuse goiter without thyrotoxic crisis or storm: Secondary | ICD-10-CM

## 2020-01-24 MED ORDER — ALPRAZOLAM 0.25 MG PO TABS: ORAL_TABLET | ORAL | 1 refills | Status: DC

## 2020-01-24 NOTE — Assessment & Plan Note (Signed)
Pt is down 13 lbs by changing her eating habits!  Applauded her efforts.  Check labs to risk stratify.  Will follow.

## 2020-01-24 NOTE — Patient Instructions (Signed)
Follow up in 6 months to recheck cholesterol We'll notify you of your lab results and make any changes if needed Continue to work on healthy diet and regular exercise- you're doing great! Call with any questions or concerns Stay Safe!  Stay Healthy! 

## 2020-01-24 NOTE — Assessment & Plan Note (Signed)
Will get labs for pt's upcoming appt w/ Dr Cruzita Lederer

## 2020-01-24 NOTE — Assessment & Plan Note (Signed)
Pt's PE WNL w/ exception of obesity.  UTD on mammo, pap, colonoscopy, Tdap.  Plans to get COVID vaccine but declines flu.  Check labs.  Anticipatory guidance provided.

## 2020-01-24 NOTE — Assessment & Plan Note (Signed)
Check labs and replete prn. 

## 2020-01-24 NOTE — Progress Notes (Signed)
   Subjective:    Patient ID: Joanne Li, female    DOB: May 09, 1961, 59 y.o.   MRN: [id number]  HPI CPE- UTD on pap, mammo, Tdap, and colonoscopy.  Pt is down 13 lbs.  Eating better- 'food is not the boss of me'.   Review of Systems Patient reports no vision/ hearing changes, adenopathy,fever, persistant/recurrent hoarseness , swallowing issues, chest pain, palpitations, edema, persistant/recurrent cough, hemoptysis, dyspnea (rest/exertional/paroxysmal nocturnal), gastrointestinal bleeding (melena, rectal bleeding), abdominal pain, significant heartburn, bowel changes, GU symptoms (dysuria, hematuria, incontinence), Gyn symptoms (abnormal  bleeding, pain),  syncope, focal weakness, memory loss, numbness & tingling, skin/hair/nail changes, abnormal bruising or bleeding, anxiety, or depression.   This visit occurred during the SARS-CoV-2 public health emergency.  Safety protocols were in place, including screening questions prior to the visit, additional usage of staff PPE, and extensive cleaning of exam room while observing appropriate contact time as indicated for disinfecting solutions.       Objective:   Physical Exam General Appearance:    Alert, cooperative, no distress, appears stated age, obese  Head:    Normocephalic, without obvious abnormality, atraumatic  Eyes:    PERRL, conjunctiva/corneas clear, EOM's intact, fundi    benign, both eyes  Ears:    Normal TM's and external ear canals, both ears  Nose:   Deferred due to COVID  Throat:   Neck:   Supple, symmetrical, trachea midline, no adenopathy;    Thyroid: no enlargement/tenderness/nodules  Back:     Symmetric, no curvature, ROM normal, no CVA tenderness  Lungs:     Clear to auscultation bilaterally, respirations unlabored  Chest Wall:    No tenderness or deformity   Heart:    Regular rate and rhythm, S1 and S2 normal, no murmur, rub   or gallop  Breast Exam:    Deferred to GYN  Abdomen:     Soft, non-tender, bowel sounds  active all four quadrants,    no masses, no organomegaly  Genitalia:    Deferred to GYN  Rectal:    Extremities:   Extremities normal, atraumatic, no cyanosis or edema  Pulses:   2+ and symmetric all extremities  Skin:   Skin color, texture, turgor normal, no rashes or lesions  Lymph nodes:   Cervical, supraclavicular, and axillary nodes normal  Neurologic:   CNII-XII intact, normal strength, sensation and reflexes    throughout          Assessment & Plan:

## 2020-01-25 LAB — LIPID PANEL
Cholesterol: 173 mg/dL (ref <200)
HDL: 66 mg/dL (ref >=50)
LDL Cholesterol (Calc): 92 mg/dL (calc)
Non-HDL Cholesterol (Calc): 107 mg/dL (calc) (ref <130)
Total CHOL/HDL Ratio: 2.6 (calc) (ref <5.0)
Triglycerides: 63 mg/dL (ref <150)

## 2020-01-25 LAB — BASIC METABOLIC PANEL
BUN: 10 mg/dL (ref 7–25)
CO2: 26 mmol/L (ref 20–32)
Calcium: 9.9 mg/dL (ref 8.6–10.4)
Chloride: 105 mmol/L (ref 98–110)
Creat: 0.76 mg/dL (ref 0.50–1.05)
Glucose, Bld: 77 mg/dL (ref 65–99)
Potassium: 4.7 mmol/L (ref 3.5–5.3)
Sodium: 141 mmol/L (ref 135–146)

## 2020-01-25 LAB — CBC WITH DIFFERENTIAL/PLATELET
Absolute Monocytes: 391 cells/uL (ref 200–950)
Basophils Absolute: 21 cells/uL (ref 0–200)
Basophils Relative: 0.5 %
Eosinophils Absolute: 71 cells/uL (ref 15–500)
Eosinophils Relative: 1.7 %
HCT: 44.2 % (ref 35.0–45.0)
Hemoglobin: 14.8 g/dL (ref 11.7–15.5)
Lymphs Abs: 1546 cells/uL (ref 850–3900)
MCH: 28.2 pg (ref 27.0–33.0)
MCHC: 33.5 g/dL (ref 32.0–36.0)
MCV: 84.4 fL (ref 80.0–100.0)
MPV: 12.8 fL — ABNORMAL HIGH (ref 7.5–12.5)
Monocytes Relative: 9.3 %
Neutro Abs: 2171 cells/uL (ref 1500–7800)
Neutrophils Relative %: 51.7 %
Platelets: 200 10*3/uL (ref 140–400)
RBC: 5.24 10*6/uL — ABNORMAL HIGH (ref 3.80–5.10)
RDW: 13.6 % (ref 11.0–15.0)
Total Lymphocyte: 36.8 %
WBC: 4.2 10*3/uL (ref 3.8–10.8)

## 2020-01-25 LAB — HEPATIC FUNCTION PANEL
AG Ratio: 1.8 (calc) (ref 1.0–2.5)
ALT: 15 U/L (ref 6–29)
AST: 16 U/L (ref 10–35)
Albumin: 4.5 g/dL (ref 3.6–5.1)
Alkaline phosphatase (APISO): 46 U/L (ref 37–153)
Bilirubin, Direct: 0.1 mg/dL (ref 0.0–0.2)
Globulin: 2.5 g/dL (calc) (ref 1.9–3.7)
Indirect Bilirubin: 0.4 mg/dL (calc) (ref 0.2–1.2)
Total Bilirubin: 0.5 mg/dL (ref 0.2–1.2)
Total Protein: 7 g/dL (ref 6.1–8.1)

## 2020-01-25 LAB — VITAMIN D 25 HYDROXY (VIT D DEFICIENCY, FRACTURES): Vit D, 25-Hydroxy: 42 ng/mL (ref 30–100)

## 2020-01-25 LAB — T3, FREE: T3, Free: 3 pg/mL (ref 2.3–4.2)

## 2020-01-25 LAB — TSH: TSH: 0.54 mIU/L (ref 0.40–4.50)

## 2020-01-25 LAB — T4, FREE: Free T4: 1.3 ng/dL (ref 0.8–1.8)

## 2020-01-28 ENCOUNTER — Other Ambulatory Visit: Payer: BC Managed Care – PPO

## 2020-03-28 ENCOUNTER — Other Ambulatory Visit: Payer: Self-pay | Admitting: Family Medicine

## 2020-07-07 ENCOUNTER — Other Ambulatory Visit: Payer: Self-pay | Admitting: Family Medicine

## 2020-07-07 DIAGNOSIS — Z1231 Encounter for screening mammogram for malignant neoplasm of breast: Secondary | ICD-10-CM

## 2020-07-28 ENCOUNTER — Ambulatory Visit: Payer: BC Managed Care – PPO | Admitting: Internal Medicine

## 2020-08-27 ENCOUNTER — Other Ambulatory Visit: Payer: Self-pay

## 2020-08-27 ENCOUNTER — Ambulatory Visit
Admission: RE | Admit: 2020-08-27 | Discharge: 2020-08-27 | Disposition: A | Discharge status: Home / Self Care | Payer: BC Managed Care – PPO | Source: Ambulatory Visit | Attending: Family Medicine | Admitting: Family Medicine

## 2020-08-27 DIAGNOSIS — Z1231 Encounter for screening mammogram for malignant neoplasm of breast: Secondary | ICD-10-CM

## 2020-09-15 ENCOUNTER — Other Ambulatory Visit: Payer: Self-pay | Admitting: General Practice

## 2020-09-15 NOTE — Telephone Encounter (Signed)
Last OV 01/24/20 Alprazolam last filled 01/24/20 #90 with 1  Pt was advised to follow up in 6 months.

## 2020-09-16 ENCOUNTER — Encounter: Payer: Self-pay | Admitting: General Practice

## 2020-09-16 MED ORDER — ALPRAZOLAM 0.25 MG PO TABS: ORAL_TABLET | ORAL | 0 refills | Status: DC

## 2020-09-16 NOTE — Telephone Encounter (Signed)
Will send 30 but pt needs appt

## 2020-09-16 NOTE — Telephone Encounter (Signed)
Message sent

## 2020-09-24 ENCOUNTER — Encounter: Payer: Self-pay | Admitting: Family Medicine

## 2020-09-24 ENCOUNTER — Other Ambulatory Visit: Payer: Self-pay

## 2020-09-24 ENCOUNTER — Ambulatory Visit (INDEPENDENT_AMBULATORY_CARE_PROVIDER_SITE_OTHER): Payer: BC Managed Care – PPO | Admitting: Family Medicine

## 2020-09-24 VITALS — BP 126/72 | HR 69 | Temp 97.7°F | Resp 20 | Ht 65.0 in | Wt 209.6 lb

## 2020-09-24 DIAGNOSIS — Z23 Encounter for immunization: Secondary | ICD-10-CM | POA: Diagnosis not present

## 2020-09-24 DIAGNOSIS — K625 Hemorrhage of anus and rectum: Secondary | ICD-10-CM

## 2020-09-24 DIAGNOSIS — E669 Obesity, unspecified: Secondary | ICD-10-CM | POA: Diagnosis not present

## 2020-09-24 DIAGNOSIS — E785 Hyperlipidemia, unspecified: Secondary | ICD-10-CM

## 2020-09-24 NOTE — Assessment & Plan Note (Signed)
BMI is 34.88  Stressed need for healthy diet and recommended low carb diet.  Will continue to follow

## 2020-09-24 NOTE — Progress Notes (Signed)
   Subjective:    Patient ID: Joanne Li, female    DOB: 11/27/1961, 59 y.o.   MRN: [id number]  HPI Hyperlipidemia- chronic problem, on Crestor 20mg  daily, Omega 3 1000mg  daily.  No CP, SOB, abd pain, N/V  Blood in stool- occurs periodically.  Will be BRBPR.  Painless bleeding.  Not straining.  Last occurrence 2-3 weeks ago.  Denies dark/tarry stools.  This has been 4th episode.  Blood is 'intermingled in the stool' and on the tissue.  No external hemorrhoids.  No bleeding or leaking onto underwear- stops w/ BM.  No change in stool caliber or consistency.  Obesity- BMI 34.88  Pt reports she is not currently exercising b/c she has returned to work.  Not following any particular diet   Review of Systems For ROS see HPI   This visit occurred during the SARS-CoV-2 public health emergency.  Safety protocols were in place, including screening questions prior to the visit, additional usage of staff PPE, and extensive cleaning of exam room while observing appropriate contact time as indicated for disinfecting solutions.       Objective:   Physical Exam Vitals reviewed.  Constitutional:      General: She is not in acute distress.    Appearance: She is well-developed. She is obese. She is not ill-appearing.  HENT:     Head: Normocephalic and atraumatic.  Eyes:     Conjunctiva/sclera: Conjunctivae normal.     Pupils: Pupils are equal, round, and reactive to light.  Neck:     Thyroid: No thyromegaly.  Cardiovascular:     Rate and Rhythm: Normal rate and regular rhythm.     Heart sounds: Normal heart sounds. No murmur heard.   Pulmonary:     Effort: Pulmonary effort is normal. No respiratory distress.     Breath sounds: Normal breath sounds.  Abdominal:     General: There is no distension.     Palpations: Abdomen is soft.     Tenderness: There is no abdominal tenderness.  Genitourinary:    Comments: declined Musculoskeletal:     Cervical back: Normal range of motion and neck supple.   Lymphadenopathy:     Cervical: No cervical adenopathy.  Skin:    General: Skin is warm and dry.  Neurological:     Mental Status: She is alert and oriented to person, place, and time.  Psychiatric:        Behavior: Behavior normal.           Assessment & Plan:  BRBPR- new.  Pt reports she has had 4 separate episodes w/ the last being 2-3 weeks ago.  She says they happen sporadically and there doesn't seem to be a particular trigger.  She had a colonoscopy w/ sessile polyps.  No noted hemorrhoids or diverticula.  Will refer back to GI for assessment.  Pt expressed understanding and is in agreement w/ plan.

## 2020-09-24 NOTE — Assessment & Plan Note (Signed)
Chronic problem.  On Crestor 20mg  daily and Omega 3 1000mg  daily.  Currently asymptomatic.  Stressed need to restart healthy diet and regular exercise.  Check labs.  Adjust meds prn

## 2020-09-24 NOTE — Patient Instructions (Addendum)
Schedule your complete physical in 6 months (we'll do your 2nd shingles shot at that time) You can go to the [facility] for labs (no appt needed) or schedule a lab appt here at your convenience We'll notify you of your lab results and make any changes if needed Continue to work on healthy diet and regular exercise- you can do it! We will call you with your GI appt to assess the bleeding Call with any questions or concerns Stay Safe!  Stay Healthy!

## 2020-09-28 ENCOUNTER — Encounter: Payer: Self-pay | Admitting: Family Medicine

## 2020-09-28 ENCOUNTER — Ambulatory Visit (INDEPENDENT_AMBULATORY_CARE_PROVIDER_SITE_OTHER): Payer: BC Managed Care – PPO

## 2020-09-28 ENCOUNTER — Other Ambulatory Visit: Payer: Self-pay

## 2020-09-28 DIAGNOSIS — E669 Obesity, unspecified: Secondary | ICD-10-CM | POA: Diagnosis not present

## 2020-09-28 DIAGNOSIS — E785 Hyperlipidemia, unspecified: Secondary | ICD-10-CM | POA: Diagnosis not present

## 2020-09-28 LAB — LIPID PANEL
Cholesterol: 183 mg/dL (ref 0–200)
HDL: 74.3 mg/dL (ref >39.00)
LDL Cholesterol: 95 mg/dL (ref 0–99)
NonHDL: 108.23
Total CHOL/HDL Ratio: 2
Triglycerides: 65 mg/dL (ref 0.0–149.0)
VLDL: 13 mg/dL (ref 0.0–40.0)

## 2020-09-28 LAB — BASIC METABOLIC PANEL
BUN: 13 mg/dL (ref 6–23)
CO2: 29 mEq/L (ref 19–32)
Calcium: 9.1 mg/dL (ref 8.4–10.5)
Chloride: 106 mEq/L (ref 96–112)
Creatinine, Ser: 0.73 mg/dL (ref 0.40–1.20)
GFR: 89.92 mL/min (ref >60.00)
Glucose, Bld: 82 mg/dL (ref 70–99)
Potassium: 4.4 mEq/L (ref 3.5–5.1)
Sodium: 141 mEq/L (ref 135–145)

## 2020-09-28 LAB — CBC WITH DIFFERENTIAL/PLATELET
Basophils Absolute: 0 10*3/uL (ref 0.0–0.1)
Basophils Relative: 1.5 % (ref 0.0–3.0)
Eosinophils Absolute: 0.1 10*3/uL (ref 0.0–0.7)
Eosinophils Relative: 2.4 % (ref 0.0–5.0)
HCT: 43 % (ref 36.0–46.0)
Hemoglobin: 14 g/dL (ref 12.0–15.0)
Lymphocytes Relative: 40 % (ref 12.0–46.0)
Lymphs Abs: 1.3 10*3/uL (ref 0.7–4.0)
MCHC: 32.5 g/dL (ref 30.0–36.0)
MCV: 87.2 fl (ref 78.0–100.0)
Monocytes Absolute: 0.4 10*3/uL (ref 0.1–1.0)
Monocytes Relative: 11 % (ref 3.0–12.0)
Neutro Abs: 1.4 10*3/uL (ref 1.4–7.7)
Neutrophils Relative %: 45.1 % (ref 43.0–77.0)
Platelets: 193 10*3/uL (ref 150.0–400.0)
RBC: 4.93 Mil/uL (ref 3.87–5.11)
RDW: 14.6 % (ref 11.5–15.5)
WBC: 3.2 10*3/uL — ABNORMAL LOW (ref 4.0–10.5)

## 2020-09-28 LAB — HEPATIC FUNCTION PANEL
ALT: 20 U/L (ref 0–35)
AST: 17 U/L (ref 0–37)
Albumin: 4.2 g/dL (ref 3.5–5.2)
Alkaline Phosphatase: 56 U/L (ref 39–117)
Bilirubin, Direct: 0.1 mg/dL (ref 0.0–0.3)
Total Bilirubin: 0.3 mg/dL (ref 0.2–1.2)
Total Protein: 6.8 g/dL (ref 6.0–8.3)

## 2020-09-28 LAB — TSH: TSH: 1.4 u[IU]/mL (ref 0.35–4.50)

## 2020-09-29 ENCOUNTER — Encounter: Payer: Self-pay | Admitting: General Practice

## 2020-10-18 ENCOUNTER — Other Ambulatory Visit: Payer: Self-pay | Admitting: Family Medicine

## 2020-10-19 NOTE — Telephone Encounter (Signed)
Last OV 09/24/20 Last refill- 09/16/2020      3 tabs with 0 refill.

## 2020-11-17 ENCOUNTER — Other Ambulatory Visit: Payer: Self-pay | Admitting: Family Medicine

## 2020-11-17 NOTE — Telephone Encounter (Signed)
LFD 10/29/20 # 30 with no refills LOV 09/24/20 NOV 03/25/21 for cpe

## 2020-12-07 ENCOUNTER — Encounter: Payer: Self-pay | Admitting: Podiatry

## 2020-12-07 ENCOUNTER — Ambulatory Visit: Payer: BC Managed Care – PPO | Admitting: Podiatry

## 2020-12-07 ENCOUNTER — Other Ambulatory Visit: Payer: Self-pay

## 2020-12-07 DIAGNOSIS — M779 Enthesopathy, unspecified: Secondary | ICD-10-CM | POA: Diagnosis not present

## 2020-12-07 DIAGNOSIS — M722 Plantar fascial fibromatosis: Secondary | ICD-10-CM | POA: Diagnosis not present

## 2020-12-07 MED ORDER — TRIAMCINOLONE ACETONIDE 10 MG/ML IJ SUSP
10.0000 mg | Freq: Once | INTRAMUSCULAR | Status: AC
  Administered 2020-12-07: 16:00:00 10 mg

## 2020-12-07 NOTE — Patient Instructions (Signed)

## 2020-12-09 NOTE — Progress Notes (Signed)
Subjective:   Patient ID: Joanne Li, female   DOB: 59 y.o.   MRN: [id number]   HPI Patient states she has a lot of pain in the bottom of the right heel with inflammation and on the left she has a lot of discomfort in the fifth metatarsal head with fluid buildup.  States both are bothering her   ROS      Objective:  Physical Exam  Neurovascular status intact with exquisite discomfort right plantar fascial insertion tendon calcaneus and on the left I noted there to be inflammation pain around the fifth MPJ     Assessment:  Acute plantar fasciitis right with inflammatory capsulitis around the fifth MPJ left     Plan:  H&P conditions reviewed sterile prep and injected the plantar fascial right 3 mg Dexasone Kenalog 5 mg Xylocaine for the left did sterile prep and injected around the fifth MPJ capsule 3 mg Dexasone Kenalog 5 mg Xylocaine.  Patient will be seen back to recheck

## 2020-12-11 ENCOUNTER — Ambulatory Visit
Admission: EM | Admit: 2020-12-11 | Discharge: 2020-12-11 | Disposition: A | Discharge status: Home / Self Care | Payer: BC Managed Care – PPO | Attending: Emergency Medicine | Admitting: Emergency Medicine

## 2020-12-11 DIAGNOSIS — U071 COVID-19: Secondary | ICD-10-CM

## 2020-12-11 DIAGNOSIS — J069 Acute upper respiratory infection, unspecified: Secondary | ICD-10-CM

## 2020-12-11 NOTE — ED Triage Notes (Signed)
Pt states had a positive rapid covid test today but needs PCR for work. Pt c/o fever since Monday with cough and congestion.

## 2020-12-11 NOTE — ED Provider Notes (Signed)
[facility]    CSN: [id number] Arrival date & time: 12/11/20  [id number]      History   Chief Complaint Chief Complaint  Patient presents with  . Fever    HPI Joanne Li is a 59 y.o. female  With history as below presenting for Covid testing.  States that she had a positive rapid Covid test today, though needs PCR for work.  Endorsing fever, cough, congestion since Monday.  No difficulty breathing, chest pain.  Past Medical History:  Diagnosis Date  . Hyperlipidemia   . Insomnia   . MVP (mitral valve prolapse)   . Palpitations   . Sleep walking     Patient Active Problem List   Diagnosis Date Noted  . Obesity (BMI 30-39.9) 12/14/2018  . Vitamin D deficiency 12/14/2018  . Graves disease 11/27/2015  . 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 Palpitations 12/04/2013  . Physical exam 07/25/2011  . Screening for malignant neoplasm of the cervix 07/25/2011  . Disordered sleep 07/25/2011  . Colon cancer screening 07/25/2011  . Hyperlipidemia 08/27/2010  . UNSPECIFIED HEARING LOSS 07/23/2010  . GERD 07/23/2010  . Anxiety state 05/25/2009  . 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 ROTATOR CUFF SYNDROME, RIGHT 01/29/2008    Past Surgical History:  Procedure Laterality Date  . COLONOSCOPY  2013  . SHOULDER SURGERY  2009   right    OB History    Gravida  1   Para  1   Term      Preterm      AB      Living        SAB      IAB      Ectopic      Multiple      Live Births               Home Medications    Prior to Admission medications   Medication Sig Start Date End Date Taking? Authorizing Provider  ALPRAZolam Duanne Moron) 0.25 MG tablet TAKE 2 TABLETS TWICE A DAY AS NEEDED FOR ANXIETY (NEED APPOINTMENT) 11/17/20   Midge Minium, MD  Cholecalciferol (VITAMIN D3 PO) Take by mouth.    [provider]  Cyanocobalamin (B-12 PO) Take by mouth.    [provider]  EPINEPHrine  0.3 mg/0.3 mL IJ SOAJ injection Inject 0.3 mLs (0.3 mg total) into the muscle once. Reported on 04/25/2016 Patient not taking: Reported on 01/24/2020 04/25/16   Midge Minium, MD  Loratadine 10 MG CAPS Take by mouth.    [provider]  Omega-3 Fatty Acids (FISH OIL) 1000 MG CAPS Take 1,000 mg by mouth daily.    [provider]  rosuvastatin (CRESTOR) 20 MG tablet TAKE 1 TABLET DAILY 03/30/20   Midge Minium, MD    Family History Family History  Problem Relation Age of Onset  . Arthritis Mother   . Arthritis Father   . Coronary artery disease Father        s/p quadruple bypass  . Colon cancer Neg Hx   . Esophageal cancer Neg Hx   . Stomach cancer Neg Hx     Social History Social History   Tobacco Use  . Smoking status: Never Smoker  . Smokeless tobacco: Never Used  Vaping Use  . Vaping Use: Never used  Substance Use Topics  . Alcohol use: Yes    Alcohol/week: 0.0 standard drinks    Comment: occasionally per pt/1 drink  per month  . Drug use: No     Allergies   Metronidazole, Nitrofurantoin, and Wheat bran   Review of Systems Review of Systems  Constitutional: Positive for fever. Negative for fatigue.  HENT: Positive for congestion. Negative for dental problem, ear pain, facial swelling, hearing loss, sinus pain, sore throat, trouble swallowing and voice change.   Eyes: Negative for photophobia, pain and visual disturbance.  Respiratory: Positive for cough. Negative for shortness of breath.   Cardiovascular: Negative for chest pain and palpitations.  Gastrointestinal: Negative for diarrhea and vomiting.  Musculoskeletal: Negative for arthralgias and myalgias.  Neurological: Negative for dizziness and headaches.     Physical Exam Triage Vital Signs ED Triage Vitals [12/11/20 0850]  Enc Vitals Group     BP 115/78     Pulse Rate 73     Resp 20     Temp 98.1 F (36.7 C)     Temp Source Oral     SpO2 96 %     Weight      Height       Head Circumference      Peak Flow      Pain Score      Pain Loc      Pain Edu?      Excl. in [place]?    No data found.  Updated Vital Signs BP 115/78 (BP Location: Right Arm)   Pulse 73   Temp 98.1 F (36.7 C) (Oral)   Resp 20   LMP 08/26/2014   SpO2 96%   Visual Acuity Right Eye Distance:   Left Eye Distance:   Bilateral Distance:    Right Eye Near:   Left Eye Near:    Bilateral Near:     Physical Exam Constitutional:      General: She is not in acute distress.    Appearance: She is not ill-appearing or diaphoretic.  HENT:     Head: Normocephalic and atraumatic.     Right Ear: Tympanic membrane and ear canal normal.     Left Ear: Tympanic membrane and ear canal normal.     Mouth/Throat:     Mouth: Mucous membranes are moist.     Pharynx: Oropharynx is clear. No oropharyngeal exudate or posterior oropharyngeal erythema.  Eyes:     General: No scleral icterus.    Conjunctiva/sclera: Conjunctivae normal.     Pupils: Pupils are equal, round, and reactive to light.  Neck:     Comments: Trachea midline, negative JVD Cardiovascular:     Rate and Rhythm: Normal rate and regular rhythm.     Heart sounds: No murmur heard. No gallop.   Pulmonary:     Effort: Pulmonary effort is normal. No respiratory distress.     Breath sounds: No wheezing, rhonchi or rales.  Musculoskeletal:     Cervical back: Neck supple. No tenderness.  Lymphadenopathy:     Cervical: No cervical adenopathy.  Skin:    Capillary Refill: Capillary refill takes less than 2 seconds.     Coloration: Skin is not jaundiced or pale.     Findings: No rash.  Neurological:     General: No focal deficit present.     Mental Status: She is alert and oriented to person, place, and time.      UC Treatments / Results  Labs (all labs ordered are listed, but only abnormal results are displayed) Labs Reviewed  NOVEL CORONAVIRUS, NAA    EKG   Radiology No results found.  Procedures Procedures  (  including critical care time)  Medications Ordered in UC Medications - No data to display  Initial Impression / Assessment and Plan / UC Course  I have reviewed the triage vital signs and the nursing notes.  Pertinent labs & imaging results that were available during my care of the patient were reviewed by me and considered in my medical decision making (see chart for details).     Patient afebrile, nontoxic, with SpO2 96%.  Covid PCR pending.  Patient to quarantine until results are back.  We will treat supportively as outlined below.  Return precautions discussed, patient verbalized understanding and is agreeable to plan. Final Clinical Impressions(s) / UC Diagnoses   Final diagnoses:  Viral URI with cough  Lab test positive for detection of COVID-19 virus     Discharge Instructions     Your COVID test is pending - it is important to quarantine / isolate at home until your results are back. If you test positive and would like further evaluation for persistent or worsening symptoms, you may schedule an E-visit or virtual (video) visit throughout the [facility] app or website.  PLEASE NOTE: If you develop severe chest pain or shortness of breath please go to the ER or call 9-1-1 for further evaluation --> DO NOT schedule electronic or virtual visits for this. Please call our office for further guidance / recommendations as needed.  For information about the Covid vaccine, please visit [email or web address]    ED Prescriptions    None     PDMP not reviewed this encounter.   Hall[place], [place], PA-C 12/11/20 0930

## 2020-12-11 NOTE — Discharge Instructions (Signed)
Your COVID test is pending - it is important to quarantine / isolate at home until your results are back. °If you test positive and would like further evaluation for persistent or worsening symptoms, you may schedule an E-visit or virtual (video) visit throughout the [facility] MyChart app or website. ° °PLEASE NOTE: If you develop severe chest pain or shortness of breath please go to the ER or call 9-1-1 for further evaluation --> DO NOT schedule electronic or virtual visits for this. °Please call our office for further guidance / recommendations as needed. ° °For information about the Covid vaccine, please visit [email or web address] °

## 2020-12-15 LAB — NOVEL CORONAVIRUS, NAA: SARS-CoV-2, NAA: DETECTED — AB

## 2020-12-27 ENCOUNTER — Other Ambulatory Visit: Payer: Self-pay | Admitting: Family Medicine

## 2020-12-28 NOTE — Telephone Encounter (Signed)
Xanax last rx 11/17/20 #30 LOV: 09/24/20 Hyperlipidemia NOV: 03/25/21 No CSC

## 2021-03-25 ENCOUNTER — Encounter: Payer: BC Managed Care – PPO | Admitting: Family Medicine

## 2021-04-06 ENCOUNTER — Encounter: Payer: BC Managed Care – PPO | Admitting: Family Medicine

## 2021-04-07 ENCOUNTER — Other Ambulatory Visit: Payer: Self-pay

## 2021-04-07 ENCOUNTER — Encounter: Payer: Self-pay | Admitting: Family Medicine

## 2021-04-07 ENCOUNTER — Ambulatory Visit (INDEPENDENT_AMBULATORY_CARE_PROVIDER_SITE_OTHER): Payer: BC Managed Care – PPO | Admitting: Family Medicine

## 2021-04-07 VITALS — BP 120/82 | HR 90 | Temp 98.3°F | Resp 20 | Ht 65.0 in | Wt 225.4 lb

## 2021-04-07 DIAGNOSIS — E559 Vitamin D deficiency, unspecified: Secondary | ICD-10-CM

## 2021-04-07 DIAGNOSIS — K219 Gastro-esophageal reflux disease without esophagitis: Secondary | ICD-10-CM

## 2021-04-07 DIAGNOSIS — Z0000 Encounter for general adult medical examination without abnormal findings: Secondary | ICD-10-CM | POA: Diagnosis not present

## 2021-04-07 DIAGNOSIS — E669 Obesity, unspecified: Secondary | ICD-10-CM | POA: Diagnosis not present

## 2021-04-07 LAB — CBC WITH DIFFERENTIAL/PLATELET
Basophils Absolute: 0 10*3/uL (ref 0.0–0.1)
Basophils Relative: 0.6 % (ref 0.0–3.0)
Eosinophils Absolute: 0.1 10*3/uL (ref 0.0–0.7)
Eosinophils Relative: 1.5 % (ref 0.0–5.0)
HCT: 44 % (ref 36.0–46.0)
Hemoglobin: 14.4 g/dL (ref 12.0–15.0)
Lymphocytes Relative: 33.9 % (ref 12.0–46.0)
Lymphs Abs: 1.7 10*3/uL (ref 0.7–4.0)
MCHC: 32.7 g/dL (ref 30.0–36.0)
MCV: 86.5 fl (ref 78.0–100.0)
Monocytes Absolute: 0.4 10*3/uL (ref 0.1–1.0)
Monocytes Relative: 7 % (ref 3.0–12.0)
Neutro Abs: 2.9 10*3/uL (ref 1.4–7.7)
Neutrophils Relative %: 57 % (ref 43.0–77.0)
Platelets: 199 10*3/uL (ref 150.0–400.0)
RBC: 5.09 Mil/uL (ref 3.87–5.11)
RDW: 14.6 % (ref 11.5–15.5)
WBC: 5.1 10*3/uL (ref 4.0–10.5)

## 2021-04-07 LAB — BASIC METABOLIC PANEL
BUN: 13 mg/dL (ref 6–23)
CO2: 25 mEq/L (ref 19–32)
Calcium: 9.8 mg/dL (ref 8.4–10.5)
Chloride: 102 mEq/L (ref 96–112)
Creatinine, Ser: 0.89 mg/dL (ref 0.40–1.20)
GFR: 70.73 mL/min (ref >60.00)
Glucose, Bld: 67 mg/dL — ABNORMAL LOW (ref 70–99)
Potassium: 3.9 mEq/L (ref 3.5–5.1)
Sodium: 138 mEq/L (ref 135–145)

## 2021-04-07 LAB — LIPID PANEL
Cholesterol: 175 mg/dL (ref 0–200)
HDL: 71.2 mg/dL (ref >39.00)
LDL Cholesterol: 89 mg/dL (ref 0–99)
NonHDL: 103.65
Total CHOL/HDL Ratio: 2
Triglycerides: 72 mg/dL (ref 0.0–149.0)
VLDL: 14.4 mg/dL (ref 0.0–40.0)

## 2021-04-07 LAB — HEPATIC FUNCTION PANEL
ALT: 17 U/L (ref 0–35)
AST: 17 U/L (ref 0–37)
Albumin: 4.6 g/dL (ref 3.5–5.2)
Alkaline Phosphatase: 54 U/L (ref 39–117)
Bilirubin, Direct: 0.1 mg/dL (ref 0.0–0.3)
Total Bilirubin: 0.5 mg/dL (ref 0.2–1.2)
Total Protein: 7.5 g/dL (ref 6.0–8.3)

## 2021-04-07 LAB — VITAMIN D 25 HYDROXY (VIT D DEFICIENCY, FRACTURES): VITD: 28.69 ng/mL — ABNORMAL LOW (ref 30.00–100.00)

## 2021-04-07 LAB — TSH: TSH: 1.17 u[IU]/mL (ref 0.35–4.50)

## 2021-04-07 MED ORDER — OMEPRAZOLE 40 MG PO CPDR: 40.0000 mg | DELAYED_RELEASE_CAPSULE | Freq: Every day | ORAL | 3 refills | Status: DC

## 2021-04-07 NOTE — Patient Instructions (Signed)
Follow up in 6 months to recheck cholesterol and weight loss progress We'll notify you of your lab results and make any changes if needed Call and schedule your GYN exam Continue to work on healthy diet and regular exercise- you can do it! START the Omeprazole once daily for the reflux while working on diet and exercise Call with any questions or concerns Happy Mother's Day!

## 2021-04-07 NOTE — Progress Notes (Signed)
   Subjective:    Patient ID: Joanne Li, female    DOB: 07-Aug-1961, 60 y.o.   MRN: [id number]  HPI CPE- UTD on colonoscopy, mammo, Tdap, COVID.  Due for pap (done 2019)  Reviewed past medical, surgical, family and social histories.   Patient Care Team    Relationship Specialty Notifications Start End  Midge Minium, MD PCP - General   11/24/10   Harold Hedge, Darrick Grinder, MD  Allergy and Immunology  10/27/15   Lubertha Sayres, MD Referring Physician Dermatology  10/27/15   Ena Dawley, MD Consulting Physician Obstetrics and Gynecology  10/27/15   Jerene Bears, MD Consulting Physician Gastroenterology  10/27/15     Health Maintenance  Topic Date Due  . Hepatitis C Screening  Never done  . PAP SMEAR-Modifier  02/27/2021  . HIV Screening  04/07/2022 (Originally 05/19/1976)  . INFLUENZA VACCINE  07/12/2021  . TETANUS/TDAP  08/21/2022  . MAMMOGRAM  08/27/2022  . COLONOSCOPY (Pts 45-62yrs Insurance coverage will need to be confirmed)  03/06/2023  . COVID-19 Vaccine  Completed  . HPV VACCINES  Aged Out      Review of Systems Patient reports no hearing changes, adenopathy,fever, persistant/recurrent hoarseness , swallowing issues, chest pain, palpitations, edema, persistant/recurrent cough, hemoptysis, dyspnea (rest/exertional/paroxysmal nocturnal), gastrointestinal bleeding (melena, rectal bleeding), abdominal pain, bowel changes, GU symptoms (dysuria, hematuria, incontinence), Gyn symptoms (abnormal  bleeding, pain),  syncope, focal weakness, memory loss, numbness & tingling, hair/nail changes, abnormal bruising or bleeding, anxiety, or depression.   + worsening vision + 15 lb weight gain + GERD LLQ pain- only when pressing against a table at work Scalp plaques- saw derm  This visit occurred during the SARS-CoV-2 public health emergency.  Safety protocols were in place, including screening questions prior to the visit, additional usage of staff PPE, and extensive cleaning of  exam room while observing appropriate contact time as indicated for disinfecting solutions.       Objective:   Physical Exam General Appearance:    Alert, cooperative, no distress, appears stated age, obese  Head:    Normocephalic, without obvious abnormality, atraumatic  Eyes:    PERRL, conjunctiva/corneas clear, EOM's intact, fundi    benign, both eyes  Ears:    Normal TM's and external ear canals, both ears  Nose:  deferred due to COVID  Throat:   Neck:   Supple, symmetrical, trachea midline, no adenopathy;    Thyroid: no enlargement/tenderness/nodules  Back:     Symmetric, no curvature, ROM normal, no CVA tenderness  Lungs:     Clear to auscultation bilaterally, respirations unlabored  Chest Wall:    No tenderness or deformity   Heart:    Regular rate and rhythm, S1 and S2 normal, no murmur, rub   or gallop  Breast Exam:    Deferred to mammo  Abdomen:     Soft, non-tender, bowel sounds active all four quadrants,    no masses, no organomegaly  Genitalia:    Deferred to GYN  Rectal:    Extremities:   Extremities normal, atraumatic, no cyanosis or edema  Pulses:   2+ and symmetric all extremities  Skin:   Skin color, texture, turgor normal, no rashes or lesions  Lymph nodes:   Cervical, supraclavicular, and axillary nodes normal  Neurologic:   CNII-XII intact, normal strength, sensation and reflexes    throughout          Assessment & Plan:

## 2021-04-08 ENCOUNTER — Other Ambulatory Visit: Payer: Self-pay

## 2021-04-08 MED ORDER — VITAMIN D (ERGOCALCIFEROL) 1.25 MG (50000 UNIT) PO CAPS: 50000.0000 [IU] | ORAL_CAPSULE | ORAL | 0 refills | Status: AC

## 2021-04-13 NOTE — Assessment & Plan Note (Signed)
Pt's PE WNL w/ exception of obesity.  UTD on colonoscopy and mammo.  Due for pap- pt to call.  UTD on Tdap and COVID.  Check labs.  Anticipatory guidance provided.

## 2021-04-13 NOTE — Assessment & Plan Note (Signed)
Deteriorated.  Start prescription PPI.  Reviewed dietary and lifestyle modifications that will also help.  Pt expressed understanding and is in agreement w/ plan.

## 2021-04-13 NOTE — Assessment & Plan Note (Signed)
Deteriorated.  Pt has had 15 lb weight gain since last visit.  Discussed need for healthy diet and regular exercise.  Check labs to risk stratify.  Will follow.

## 2021-04-13 NOTE — Assessment & Plan Note (Signed)
Check labs and replete prn. 

## 2021-04-30 ENCOUNTER — Other Ambulatory Visit: Payer: Self-pay

## 2021-04-30 DIAGNOSIS — E785 Hyperlipidemia, unspecified: Secondary | ICD-10-CM

## 2021-04-30 MED ORDER — ROSUVASTATIN CALCIUM 20 MG PO TABS: 20.0000 mg | ORAL_TABLET | Freq: Every day | ORAL | 0 refills | Status: DC

## 2021-05-26 ENCOUNTER — Other Ambulatory Visit: Payer: Self-pay | Admitting: Family Medicine

## 2021-05-26 NOTE — Telephone Encounter (Signed)
LFD 12/28/20 #90 with no refills LOV 04/07/21 NOV 09/20/21

## 2021-05-27 ENCOUNTER — Other Ambulatory Visit: Payer: Self-pay | Admitting: Family Medicine

## 2021-05-27 MED ORDER — ALPRAZOLAM 0.25 MG PO TABS: ORAL_TABLET | ORAL | 3 refills | Status: DC

## 2021-05-27 NOTE — Progress Notes (Signed)
Prescription changed to reflect quantity required by insurance and sent to local pharmacy

## 2021-06-02 ENCOUNTER — Other Ambulatory Visit: Payer: Self-pay | Admitting: Family Medicine

## 2021-06-02 MED ORDER — ALPRAZOLAM 0.25 MG PO TABS: ORAL_TABLET | ORAL | 1 refills | Status: DC

## 2021-06-02 NOTE — Progress Notes (Signed)
Will change to #120 for a possible 30 day supply [facility]

## 2021-06-07 ENCOUNTER — Encounter: Payer: Self-pay | Admitting: Family Medicine

## 2021-06-09 ENCOUNTER — Encounter: Payer: Self-pay | Admitting: *Deleted

## 2021-07-14 ENCOUNTER — Other Ambulatory Visit: Payer: Self-pay | Admitting: Family Medicine

## 2021-07-14 ENCOUNTER — Other Ambulatory Visit: Payer: Self-pay | Admitting: Obstetrics and Gynecology

## 2021-07-14 DIAGNOSIS — Z1231 Encounter for screening mammogram for malignant neoplasm of breast: Secondary | ICD-10-CM

## 2021-07-20 DIAGNOSIS — Z8742 Personal history of other diseases of the female genital tract: Secondary | ICD-10-CM | POA: Diagnosis not present

## 2021-07-20 DIAGNOSIS — Z01419 Encounter for gynecological examination (general) (routine) without abnormal findings: Secondary | ICD-10-CM | POA: Diagnosis not present

## 2021-07-23 LAB — HM PAP SMEAR
HM Pap smear: NEGATIVE
HPV, high-risk: NEGATIVE

## 2021-08-25 ENCOUNTER — Encounter: Payer: Self-pay | Admitting: Family Medicine

## 2021-09-15 ENCOUNTER — Ambulatory Visit
Admission: RE | Admit: 2021-09-15 | Discharge: 2021-09-15 | Disposition: A | Discharge status: Home / Self Care | Payer: BC Managed Care – PPO | Source: Ambulatory Visit | Attending: Family Medicine | Admitting: Family Medicine

## 2021-09-15 ENCOUNTER — Other Ambulatory Visit: Payer: Self-pay

## 2021-09-15 DIAGNOSIS — Z1231 Encounter for screening mammogram for malignant neoplasm of breast: Secondary | ICD-10-CM

## 2021-09-20 ENCOUNTER — Ambulatory Visit (INDEPENDENT_AMBULATORY_CARE_PROVIDER_SITE_OTHER): Payer: BC Managed Care – PPO | Admitting: Family Medicine

## 2021-09-20 ENCOUNTER — Other Ambulatory Visit: Payer: Self-pay

## 2021-09-20 ENCOUNTER — Encounter: Payer: Self-pay | Admitting: Family Medicine

## 2021-09-20 VITALS — BP 130/82 | HR 89 | Temp 97.9°F | Resp 18 | Ht 65.0 in | Wt 231.0 lb

## 2021-09-20 DIAGNOSIS — E785 Hyperlipidemia, unspecified: Secondary | ICD-10-CM

## 2021-09-20 DIAGNOSIS — R0789 Other chest pain: Secondary | ICD-10-CM

## 2021-09-20 DIAGNOSIS — E669 Obesity, unspecified: Secondary | ICD-10-CM

## 2021-09-20 DIAGNOSIS — Z23 Encounter for immunization: Secondary | ICD-10-CM

## 2021-09-20 DIAGNOSIS — D229 Melanocytic nevi, unspecified: Secondary | ICD-10-CM

## 2021-09-20 MED ORDER — ROSUVASTATIN CALCIUM 20 MG PO TABS
20.0000 mg | ORAL_TABLET | Freq: Every day | ORAL | 1 refills | Status: DC
Start: 2021-09-20 — End: 2022-08-29

## 2021-09-20 NOTE — Patient Instructions (Signed)
Schedule your complete physical in 6 months We'll notify you of your lab results and make any changes if needed We'll call you with your dermatology appt for the mole above your ear Try and work on regular physical activity and stress management If your stress worsens or becomes harder to control, let me know and we can talk about medication IF your chest tightness becomes more frequent or starts to occur w/ activity- please let me know and we'll send you for a stress test ASAP Call with any questions or concerns Hang in there!!

## 2021-09-20 NOTE — Progress Notes (Signed)
   Subjective:    Patient ID: Joanne Li, female    DOB: 11/07/1961, 60 y.o.   MRN: [id number]  HPI Hyperlipidemia- chronic problem.  Pt is on Crestor 20mg  daily.  Denies abd pain, N/V.  Obesity- pt has gained 6 lbs and BMI is now 38.44  Chest tightness- pt reports she has sxs upon waking.  She reports 'it's stress from here over' indicating L breast to shoulder.  Sxs improve w/ movement.  She states sxs are different than GERD.  Denies SOB.  Denies nausea, dizziness, diaphoresis.  Occurring most mornings.  Pt wonders if it is positional based on how she has to sleep to accommodate husband's CPAP.  No tightness w/ physical exertion.    'i have a mole I need you to look at'- behind L ear, new.    Pt desires shingles shot   Review of Systems For ROS see HPI   This visit occurred during the SARS-CoV-2 public health emergency.  Safety protocols were in place, including screening questions prior to the visit, additional usage of staff PPE, and extensive cleaning of exam room while observing appropriate contact time as indicated for disinfecting solutions.      Objective:   Physical Exam Constitutional:      General: She is not in acute distress.    Appearance: Normal appearance. She is well-developed. She is obese. She is not ill-appearing.  HENT:     Head: Normocephalic and atraumatic.  Eyes:     Conjunctiva/sclera: Conjunctivae normal.     Pupils: Pupils are equal, round, and reactive to light.  Neck:     Thyroid: No thyromegaly.  Cardiovascular:     Rate and Rhythm: Normal rate and regular rhythm.     Pulses: Normal pulses.     Heart sounds: Normal heart sounds. No murmur heard. Pulmonary:     Effort: Pulmonary effort is normal. No respiratory distress.     Breath sounds: Normal breath sounds.  Abdominal:     General: There is no distension.     Palpations: Abdomen is soft.     Tenderness: There is no abdominal tenderness.  Musculoskeletal:     Cervical back: Normal range  of motion and neck supple.     Right lower leg: No edema.     Left lower leg: No edema.  Lymphadenopathy:     Cervical: No cervical adenopathy.  Skin:    General: Skin is warm and dry.     Comments: New hyperpigmented nevus above L ear  Neurological:     Mental Status: She is alert and oriented to person, place, and time.  Psychiatric:        Behavior: Behavior normal.          Assessment & Plan:   Chest tightness- new.  Pt reports this will occur each morning and then ease as the day goes on.  No sxs w/ exertion and if sxs are present they actually improve w/ movement.  States she has been sleeping differently to accommodate husband and his CPAP.  May be positional.  EKG unchanged from previous.  Pt instructed on red flags that should prompt immediate return.  Pt expressed understanding and is in agreement w/ plan.   Atypical mole- new.  Small but very dark.  Will refer to derm for complete evaluation.  Pt expressed understanding and is in agreement w/ plan.

## 2021-09-21 LAB — LIPID PANEL
Cholesterol: 175 mg/dL (ref 0–200)
HDL: 74.5 mg/dL (ref >39.00)
LDL Cholesterol: 85 mg/dL (ref 0–99)
NonHDL: 100.65
Total CHOL/HDL Ratio: 2
Triglycerides: 78 mg/dL (ref 0.0–149.0)
VLDL: 15.6 mg/dL (ref 0.0–40.0)

## 2021-09-21 LAB — HEPATIC FUNCTION PANEL
ALT: 18 U/L (ref 0–35)
AST: 20 U/L (ref 0–37)
Albumin: 4.2 g/dL (ref 3.5–5.2)
Alkaline Phosphatase: 49 U/L (ref 39–117)
Bilirubin, Direct: 0.1 mg/dL (ref 0.0–0.3)
Total Bilirubin: 0.5 mg/dL (ref 0.2–1.2)
Total Protein: 6.8 g/dL (ref 6.0–8.3)

## 2021-09-21 LAB — BASIC METABOLIC PANEL
BUN: 14 mg/dL (ref 6–23)
CO2: 25 mEq/L (ref 19–32)
Calcium: 9.4 mg/dL (ref 8.4–10.5)
Chloride: 106 mEq/L (ref 96–112)
Creatinine, Ser: 0.79 mg/dL (ref 0.40–1.20)
GFR: 81.35 mL/min (ref >60.00)
Glucose, Bld: 66 mg/dL — ABNORMAL LOW (ref 70–99)
Potassium: 3.9 mEq/L (ref 3.5–5.1)
Sodium: 140 mEq/L (ref 135–145)

## 2021-09-21 LAB — CBC WITH DIFFERENTIAL/PLATELET
Basophils Absolute: 0 10*3/uL (ref 0.0–0.1)
Basophils Relative: 0.5 % (ref 0.0–3.0)
Eosinophils Absolute: 0.1 10*3/uL (ref 0.0–0.7)
Eosinophils Relative: 1.3 % (ref 0.0–5.0)
HCT: 42.4 % (ref 36.0–46.0)
Hemoglobin: 13.6 g/dL (ref 12.0–15.0)
Lymphocytes Relative: 32.8 % (ref 12.0–46.0)
Lymphs Abs: 1.8 10*3/uL (ref 0.7–4.0)
MCHC: 32.1 g/dL (ref 30.0–36.0)
MCV: 87.6 fl (ref 78.0–100.0)
Monocytes Absolute: 0.4 10*3/uL (ref 0.1–1.0)
Monocytes Relative: 6.3 % (ref 3.0–12.0)
Neutro Abs: 3.3 10*3/uL (ref 1.4–7.7)
Neutrophils Relative %: 59.1 % (ref 43.0–77.0)
Platelets: 187 10*3/uL (ref 150.0–400.0)
RBC: 4.83 Mil/uL (ref 3.87–5.11)
RDW: 14.8 % (ref 11.5–15.5)
WBC: 5.6 10*3/uL (ref 4.0–10.5)

## 2021-09-21 LAB — TSH: TSH: 1.4 u[IU]/mL (ref 0.35–5.50)

## 2021-09-21 NOTE — Assessment & Plan Note (Signed)
Chronic problem.  On Crestor 20mg daily w/o difficulty.  Check labs.  Adjust meds prn  

## 2021-09-21 NOTE — Assessment & Plan Note (Signed)
Deteriorated.  Pt has gained 6 lbs and BMI is now 38.44  Discussed the importance of healthy diet and regular exercise- in addition to the walking and standing she does at work.  Check labs to risk stratify.  Will follow.

## 2021-10-12 DIAGNOSIS — L821 Other seborrheic keratosis: Secondary | ICD-10-CM | POA: Diagnosis not present

## 2022-01-03 ENCOUNTER — Other Ambulatory Visit: Payer: Self-pay | Admitting: Obstetrics and Gynecology

## 2022-01-18 IMAGING — MG MM DIGITAL SCREENING BILAT W/ TOMO AND CAD
8 series · 8 of 24 positions shown · non-contrast
Comparison: Previous exam(s).

ACR Breast Density Category a: The breast tissue is almost entirely
fatty.

CLINICAL DATA: Screening.

EXAM:
DIGITAL SCREENING BILATERAL MAMMOGRAM WITH TOMOSYNTHESIS AND CAD
TECHNIQUE: Bilateral screening digital craniocaudal and mediolateral oblique
mammograms were obtained. Bilateral screening digital breast
tomosynthesis was performed. The images were evaluated with
computer-aided detection.

[L CC synth-2D]
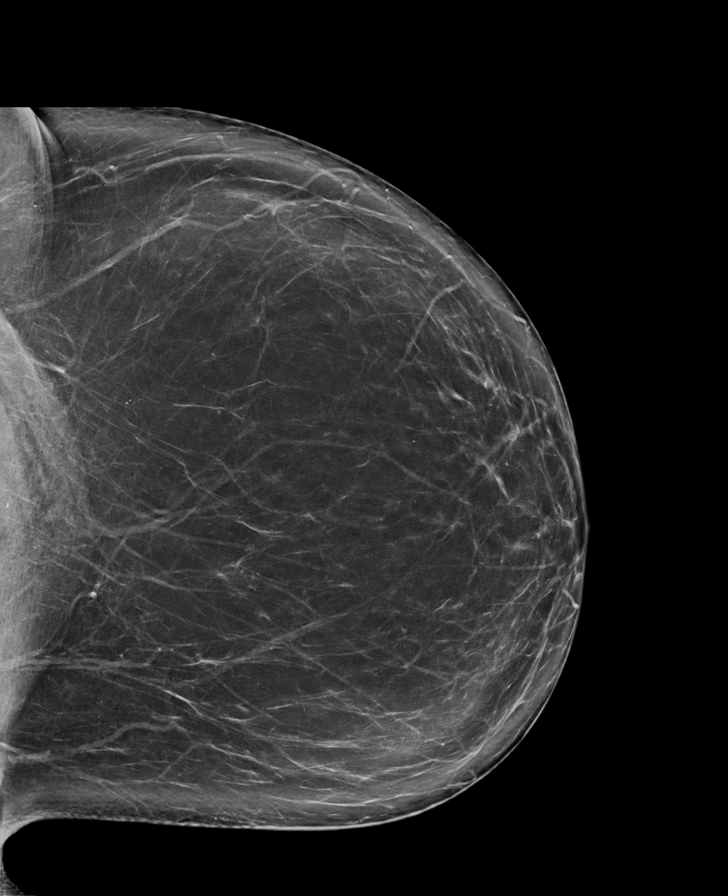

[R MLO synth-2D]
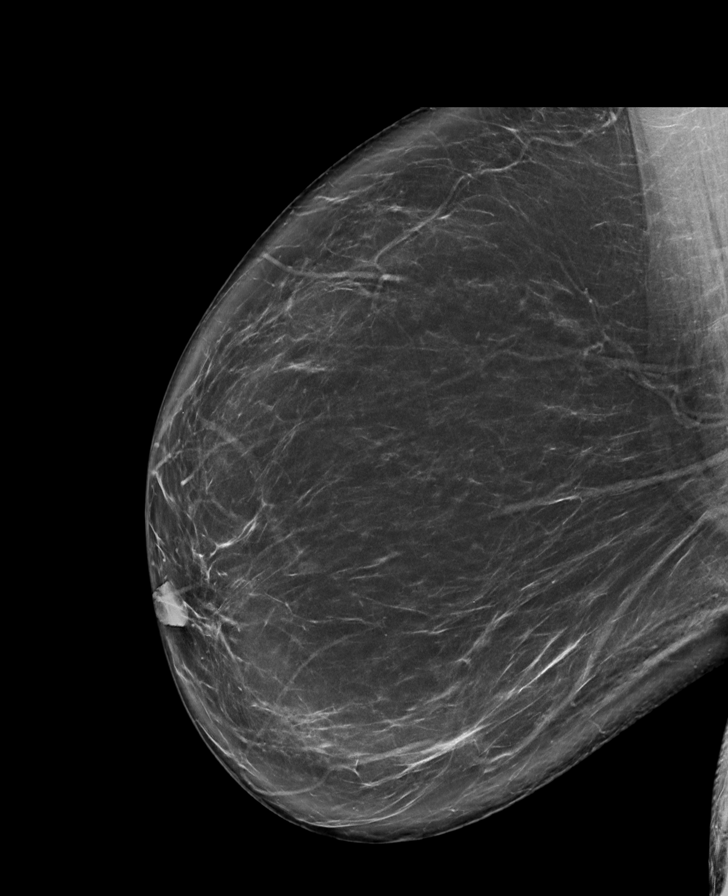

[L MLO synth-2D]
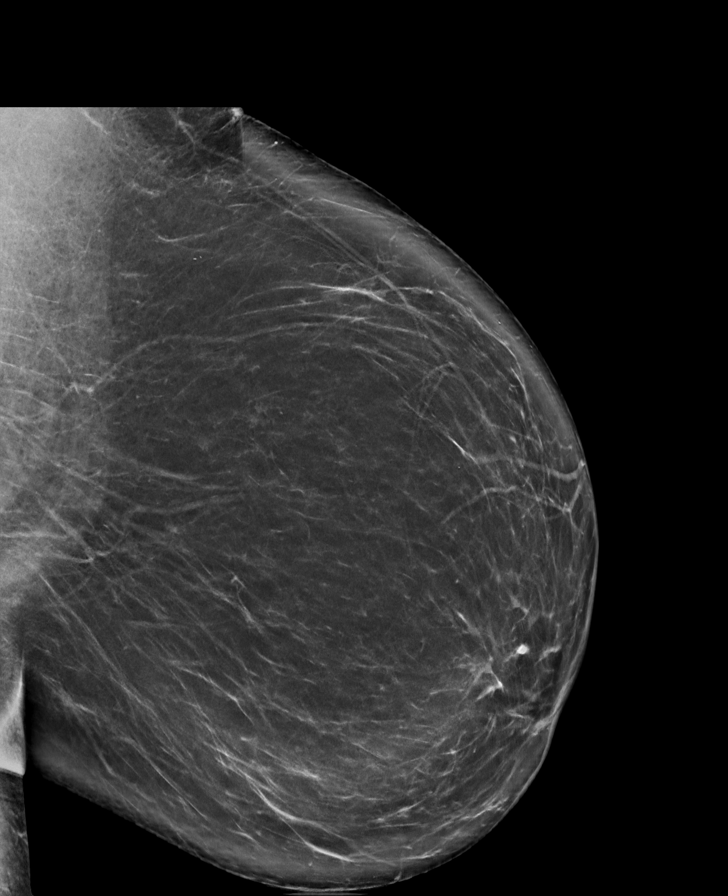

[R CC synth-2D]
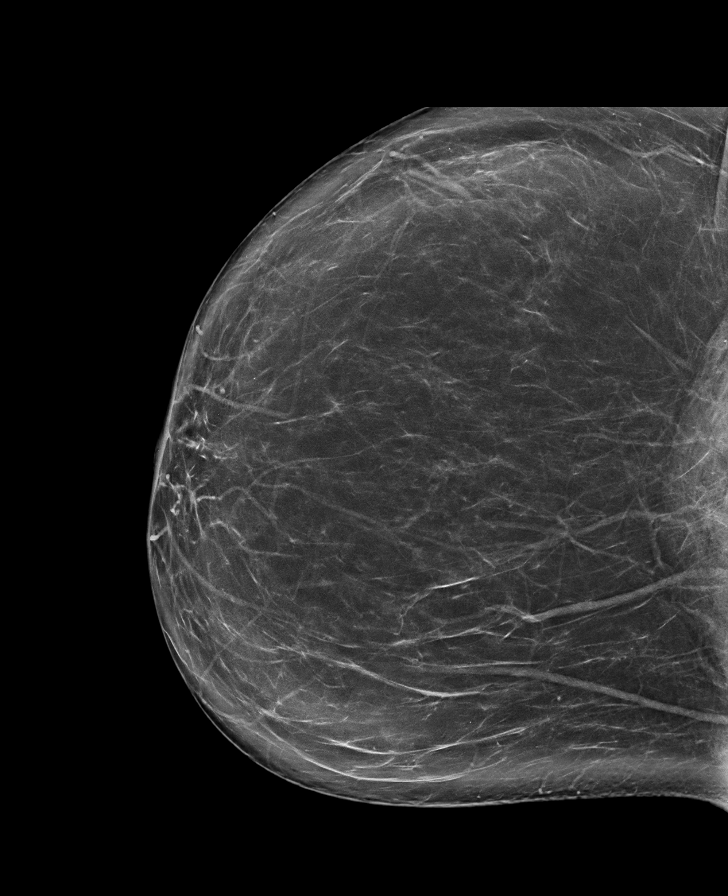

[R CC tomo · tomo slice 47/93.0]
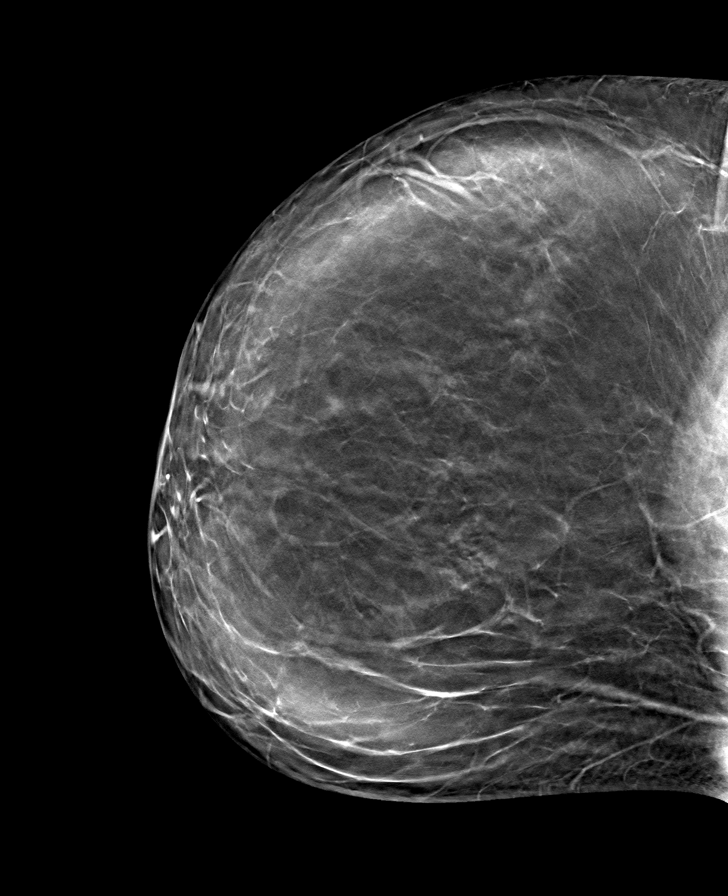

[L CC tomo · tomo slice 47/94.0]
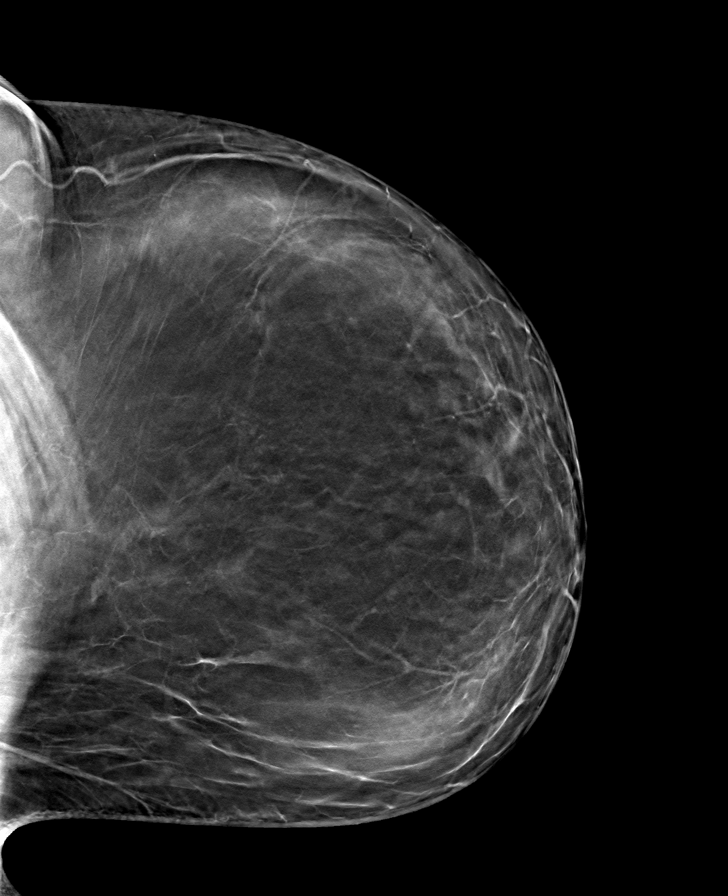

[L MLO tomo · tomo slice 50/99.0]
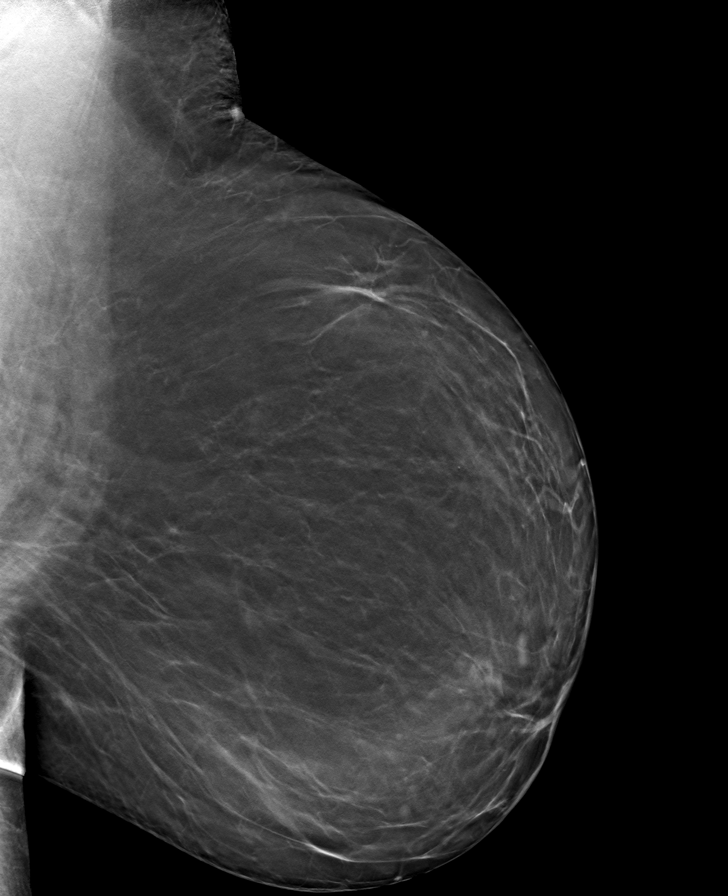

[R MLO tomo · tomo slice 51/101.0]
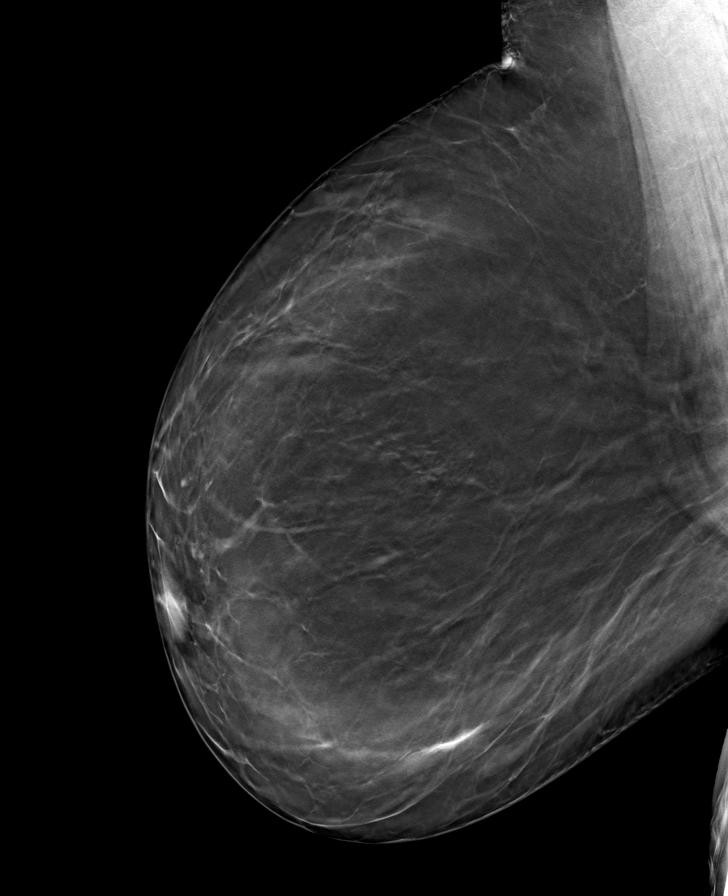

[8 of 24 positions shown; findings below may reference images not displayed]

FINDINGS: There are no findings suspicious for malignancy.
IMPRESSION: No mammographic evidence of malignancy. A result letter of this
screening mammogram will be mailed directly to the patient.

RECOMMENDATION:
Screening mammogram in one year. (Code:0E-3-N98)

BI-RADS CATEGORY  1: Negative.

## 2022-05-26 ENCOUNTER — Encounter: Payer: 59 | Admitting: Family Medicine

## 2022-05-31 ENCOUNTER — Telehealth: Payer: Self-pay

## 2022-05-31 MED ORDER — ALPRAZOLAM 0.25 MG PO TABS: ORAL_TABLET | ORAL | 1 refills | Status: DC

## 2022-05-31 NOTE — Telephone Encounter (Signed)
Prescription sent to requested pharmacy. 

## 2022-05-31 NOTE — Telephone Encounter (Signed)
Patient called in stating that she needs [facility]. [facility] have [facility]

## 2022-06-01 ENCOUNTER — Encounter: Payer: BC Managed Care – PPO | Admitting: Family Medicine

## 2022-06-02 ENCOUNTER — Telehealth: Payer: Self-pay | Admitting: Family Medicine

## 2022-08-25 LAB — HM PAP SMEAR

## 2022-08-29 ENCOUNTER — Other Ambulatory Visit: Payer: Self-pay

## 2022-08-29 ENCOUNTER — Telehealth: Payer: Self-pay | Admitting: Family Medicine

## 2022-08-29 DIAGNOSIS — E785 Hyperlipidemia, unspecified: Secondary | ICD-10-CM

## 2022-08-29 MED ORDER — ROSUVASTATIN CALCIUM 20 MG PO TABS: 20.0000 mg | ORAL_TABLET | Freq: Every day | ORAL | 1 refills | Status: DC

## 2022-08-29 NOTE — Telephone Encounter (Signed)
Refill sent to pharmacy  Pt must make an apt before any  more refills are given

## 2022-08-29 NOTE — Telephone Encounter (Signed)
Encourage patient to contact the pharmacy for refills or they can request refills through [facility]  (Please schedule appointment if patient has not been seen in over a year)  Last appt was 09/20/21  WHAT PHARMACY WOULD THEY LIKE THIS SENT TO: [id number] [place] [address]  MEDICATION NAME & DOSE: Rosuvastatin 20 mg   NOTES/COMMENTS FROM PATIENT: Pt is aware that she needs an appt. Pt states that her insurance changed and due to high deductible. Pt can't afford a office visit at that moment. Pt states that she will have better insurance January 2024.      [place]: It takes 48-72 hours to process rx refill requests Ask patient to call pharmacy to ensure rx is ready before heading there.

## 2022-08-31 ENCOUNTER — Other Ambulatory Visit: Payer: Self-pay | Admitting: Family Medicine

## 2022-08-31 DIAGNOSIS — Z1231 Encounter for screening mammogram for malignant neoplasm of breast: Secondary | ICD-10-CM

## 2022-09-23 ENCOUNTER — Ambulatory Visit: Payer: 59

## 2022-10-14 ENCOUNTER — Ambulatory Visit
Admission: RE | Admit: 2022-10-14 | Discharge: 2022-10-14 | Disposition: A | Discharge status: Home / Self Care | Payer: BC Managed Care – PPO | Source: Ambulatory Visit | Attending: Family Medicine | Admitting: Family Medicine

## 2022-10-14 DIAGNOSIS — Z1231 Encounter for screening mammogram for malignant neoplasm of breast: Secondary | ICD-10-CM

## 2023-01-26 ENCOUNTER — Ambulatory Visit: Payer: BC Managed Care – PPO | Admitting: Family Medicine

## 2023-01-26 ENCOUNTER — Encounter: Payer: Self-pay | Admitting: Family Medicine

## 2023-01-26 VITALS — BP 120/76 | HR 81 | Temp 97.2°F | Resp 17 | Ht 65.0 in | Wt 226.2 lb

## 2023-01-26 DIAGNOSIS — J069 Acute upper respiratory infection, unspecified: Secondary | ICD-10-CM

## 2023-01-26 DIAGNOSIS — F411 Generalized anxiety disorder: Secondary | ICD-10-CM

## 2023-01-26 MED ORDER — ALPRAZOLAM 0.25 MG PO TABS
ORAL_TABLET | ORAL | 1 refills | Status: DC
Start: 2023-01-26 — End: 2023-01-26

## 2023-01-26 MED ORDER — MOMETASONE FUROATE 50 MCG/ACT NA SUSP: 2.0000 | Freq: Every day | NASAL | 12 refills | Status: AC

## 2023-01-26 MED ORDER — ALPRAZOLAM 0.25 MG PO TABS: ORAL_TABLET | ORAL | 1 refills | Status: DC

## 2023-01-26 NOTE — Progress Notes (Signed)
   Subjective:    Patient ID: Joanne Li, female    DOB: Feb 18, 1961, 62 y.o.   MRN: [id number]  HPI Cough- pt reports congestion, 'a couple of coughing fits'.  Sxs started ~1 week ago.  No fever.  No body aches or chills.  No ear pain.  Mild sore throat and copious PND.  Pt reports she had sinus pressure earlier in the week but this has improved.  Denies HA.  + nasal congestion.  Took DayQuil w/ some improvement.  Taking daily Claritin.  + sick contacts.   Review of Systems For ROS see HPI     Objective:   Physical Exam Vitals reviewed.  Constitutional:      General: She is not in acute distress.    Appearance: Normal appearance. She is well-developed. She is obese. She is not ill-appearing.  HENT:     Head: Normocephalic and atraumatic.     Right Ear: Tympanic membrane and ear canal normal.     Left Ear: Tympanic membrane and ear canal normal.     Nose: Mucosal edema and congestion present. No rhinorrhea.     Right Sinus: No maxillary sinus tenderness or frontal sinus tenderness.     Left Sinus: No maxillary sinus tenderness or frontal sinus tenderness.     Mouth/Throat:     Pharynx: Posterior oropharyngeal erythema (w/ PND) present.  Eyes:     Conjunctiva/sclera: Conjunctivae normal.     Pupils: Pupils are equal, round, and reactive to light.  Cardiovascular:     Rate and Rhythm: Normal rate and regular rhythm.     Heart sounds: Normal heart sounds.  Pulmonary:     Effort: Pulmonary effort is normal. No respiratory distress.     Breath sounds: Normal breath sounds. No wheezing or rales.  Musculoskeletal:     Cervical back: Normal range of motion and neck supple.  Lymphadenopathy:     Cervical: No cervical adenopathy.  Skin:    General: Skin is warm and dry.  Neurological:     General: No focal deficit present.     Mental Status: She is alert and oriented to person, place, and time.  Psychiatric:        Mood and Affect: Mood normal.        Behavior: Behavior normal.         Thought Content: Thought content normal.           Assessment & Plan:  Viral URI- new.  No evidence of bacterial infxn on exam.  Continues supportive care and add Nasonex to decrease congestion and PND.  Reviewed supportive care and red flags that should prompt return.  Pt expressed understanding and is in agreement w/ plan.

## 2023-01-26 NOTE — Patient Instructions (Signed)
Follow up as needed or as scheduled Thankfully this appears to be a virus that is running its course Continue the DayQuil/NyQuil as needed Drink LOTS of fluids to help rinse the back of the throat ADD the nasal spray- 2 sprays each nostril daily CONTINUE the daily allergy medication Call with any questions or concerns Hang in there!

## 2023-02-23 ENCOUNTER — Other Ambulatory Visit: Payer: Self-pay | Admitting: Family Medicine

## 2023-02-23 DIAGNOSIS — E785 Hyperlipidemia, unspecified: Secondary | ICD-10-CM

## 2023-03-22 ENCOUNTER — Encounter: Payer: Self-pay | Admitting: Internal Medicine

## 2023-04-03 ENCOUNTER — Ambulatory Visit: Payer: BC Managed Care – PPO | Admitting: Family Medicine

## 2023-04-03 ENCOUNTER — Encounter: Payer: Self-pay | Admitting: Family Medicine

## 2023-04-03 VITALS — BP 120/76 | HR 75 | Temp 97.7°F | Resp 16 | Ht 65.0 in | Wt 225.4 lb

## 2023-04-03 DIAGNOSIS — R200 Anesthesia of skin: Secondary | ICD-10-CM | POA: Diagnosis not present

## 2023-04-03 LAB — VITAMIN D 25 HYDROXY (VIT D DEFICIENCY, FRACTURES): VITD: 38.33 ng/mL (ref 30.00–100.00)

## 2023-04-03 LAB — BASIC METABOLIC PANEL
BUN: 14 mg/dL (ref 6–23)
CO2: 24 mEq/L (ref 19–32)
Calcium: 9.2 mg/dL (ref 8.4–10.5)
Chloride: 105 mEq/L (ref 96–112)
Creatinine, Ser: 0.73 mg/dL (ref 0.40–1.20)
GFR: 88.48 mL/min (ref >60.00)
Glucose, Bld: 76 mg/dL (ref 70–99)
Potassium: 4.1 mEq/L (ref 3.5–5.1)
Sodium: 138 mEq/L (ref 135–145)

## 2023-04-03 LAB — CBC WITH DIFFERENTIAL/PLATELET
Basophils Absolute: 0 10*3/uL (ref 0.0–0.1)
Basophils Relative: 0.5 % (ref 0.0–3.0)
Eosinophils Absolute: 0 10*3/uL (ref 0.0–0.7)
Eosinophils Relative: 1.2 % (ref 0.0–5.0)
HCT: 42.2 % (ref 36.0–46.0)
Hemoglobin: 13.7 g/dL (ref 12.0–15.0)
Lymphocytes Relative: 36.7 % (ref 12.0–46.0)
Lymphs Abs: 1.4 10*3/uL (ref 0.7–4.0)
MCHC: 32.6 g/dL (ref 30.0–36.0)
MCV: 87.4 fl (ref 78.0–100.0)
Monocytes Absolute: 0.3 10*3/uL (ref 0.1–1.0)
Monocytes Relative: 7.5 % (ref 3.0–12.0)
Neutro Abs: 2.1 10*3/uL (ref 1.4–7.7)
Neutrophils Relative %: 54.1 % (ref 43.0–77.0)
Platelets: 200 10*3/uL (ref 150.0–400.0)
RBC: 4.82 Mil/uL (ref 3.87–5.11)
RDW: 14.7 % (ref 11.5–15.5)
WBC: 3.9 10*3/uL — ABNORMAL LOW (ref 4.0–10.5)

## 2023-04-03 LAB — HEMOGLOBIN A1C: Hgb A1c MFr Bld: 6 % (ref 4.6–6.5)

## 2023-04-03 LAB — B12 AND FOLATE PANEL
Folate: 16.4 ng/mL (ref >5.9)
Vitamin B-12: >1500 pg/mL — ABNORMAL HIGH (ref 211–911)

## 2023-04-03 LAB — TSH: TSH: 1.16 u[IU]/mL (ref 0.35–5.50)

## 2023-04-03 NOTE — Progress Notes (Unsigned)
   Subjective:    Patient ID: Joanne Li, female    DOB: [birth year], 62 y.o.   MRN: [id number]  HPI Numbness- 'i've been having numb toes on both feet'.  Pt reports toes suddenly went numb last week.  Tips of toes remain numb but improving.  Started wearing orthotic recently from [employer].  Pt reports she has not had numbness like this previously but has hx of neuropathy listed as far back as 2015.  Initially was having burning pain w/ the numbness but not currently.   Review of Systems For ROS see HPI     Objective:   Physical Exam Vitals reviewed.  Constitutional:      General: She is not in acute distress.    Appearance: Normal appearance. She is not ill-appearing.  Cardiovascular:     Pulses: Normal pulses.  Musculoskeletal:     Right lower leg: No edema.     Left lower leg: No edema.  Skin:    General: Skin is warm and dry.     Capillary Refill: Capillary refill takes less than 2 seconds.     Findings: No rash.  Neurological:     General: No focal deficit present.     Mental Status: She is alert.     Sensory: No sensory deficit.  Psychiatric:        Mood and Affect: Mood normal.        Behavior: Behavior normal.        Thought Content: Thought content normal.           Assessment & Plan:  Numbness of toes- new to provider, pt saw [facility] for similar back in 2015.  Pt reports sxs started suddenly last week when toes went numb.  Reports she did start wearing orthotics from the [employer] prior to onset of sxs.  Suspect that the numbness was due to orthotics putting significant pressure on nerves that was not there prior.  She reports sxs are slowly improving since removing those orthotics.  Will check labs to r/o underlying metabolic causes of numbness.  Encouraged her to wear cushioned, supportive shoes w/ a wide toe box.  Pulses and cap refill are excellent so this does not seem to be vascular.  Will hold on Gabapentin as she reports shooting pain has  resolved.  Will follow.

## 2023-04-03 NOTE — Patient Instructions (Signed)
Follow up in as needed or as scheduled We'll notify you of your lab results and make any changes if needed Change out the orthotics and use the less rigid/not as high as that could be compressing the nerve IF you develop pain or burning/shooting sensations, let me know and we can add Gabapentin for pain relief Call with any questions or concerns Hang in there!!!

## 2023-04-04 ENCOUNTER — Telehealth: Payer: Self-pay

## 2023-04-04 LAB — IRON,TIBC AND FERRITIN PANEL
%SAT: 32 % (calc) (ref 16–45)
Ferritin: 137 ng/mL (ref 16–288)
Iron: 96 ug/dL (ref 45–160)
TIBC: 296 mcg/dL (calc) (ref 250–450)

## 2023-04-04 NOTE — Telephone Encounter (Signed)
-----   Message from Sheliah Hatch, MD sent at 04/04/2023  9:48 AM EDT ----- Labs look great!  A1C is in the pre-diabetes range at 6% (6.5% is diabetes).  This will improve w/ low carb, low sugar diet and regular exercise.  But no cause for concern or reason for the numbness

## 2023-04-04 NOTE — Telephone Encounter (Signed)
Pt is concerned about her B12 level being high. Could you comment back and advise

## 2023-04-04 NOTE — Telephone Encounter (Signed)
Pt informed and was okay with this.  

## 2023-04-04 NOTE — Telephone Encounter (Signed)
LM for pt to call us back to provide information on her concern for B12

## 2023-04-04 NOTE — Telephone Encounter (Signed)
A high B12 level is of no concern as this is a vitamin that we just pee out if there is too much.  We only retain certain vitamins (A,D,E,K).  A B12 deficiency is important, too much is not a concern

## 2023-04-06 ENCOUNTER — Encounter: Payer: Self-pay | Admitting: Internal Medicine

## 2023-04-06 ENCOUNTER — Ambulatory Visit (AMBULATORY_SURGERY_CENTER): Payer: BC Managed Care – PPO

## 2023-04-06 VITALS — Ht 65.0 in | Wt 225.0 lb

## 2023-04-06 DIAGNOSIS — Z86010 Personal history of colonic polyps: Secondary | ICD-10-CM

## 2023-04-06 MED ORDER — NA SULFATE-K SULFATE-MG SULF 17.5-3.13-1.6 GM/177ML PO SOLN: 1.0000 | Freq: Once | ORAL | 0 refills | Status: AC

## 2023-04-06 NOTE — Progress Notes (Signed)
No egg or soy allergy known to patient  No issues known to pt with past sedation with any surgeries or procedures Patient denies ever being told they had issues or difficulty with intubation  No FH of Malignant Hyperthermia Pt is not on diet pills Pt is not on  home 02  Pt is not on blood thinners  Pt denies issues with constipation  No A fib or A flutter Have any cardiac testing pending--no Pt instructed to use Singlecare.com or GoodRx for a price reduction on prep  Patient's chart reviewed by Cathlyn Parsons CNRA prior to previsit and patient appropriate for the [facility].  Previsit completed and red dot placed by patient's name on their procedure day (on provider's schedule).   Can ambulate without assistance

## 2023-04-13 ENCOUNTER — Other Ambulatory Visit: Payer: Self-pay | Admitting: Obstetrics & Gynecology

## 2023-04-13 DIAGNOSIS — Z1231 Encounter for screening mammogram for malignant neoplasm of breast: Secondary | ICD-10-CM

## 2023-04-17 ENCOUNTER — Encounter: Payer: Self-pay | Admitting: Family Medicine

## 2023-05-01 ENCOUNTER — Ambulatory Visit (AMBULATORY_SURGERY_CENTER): Payer: BC Managed Care – PPO | Admitting: Internal Medicine

## 2023-05-01 ENCOUNTER — Encounter: Payer: Self-pay | Admitting: Internal Medicine

## 2023-05-01 VITALS — BP 133/87 | HR 86 | Temp 97.8°F | Resp 18 | Ht 65.0 in | Wt 225.0 lb

## 2023-05-01 DIAGNOSIS — Z09 Encounter for follow-up examination after completed treatment for conditions other than malignant neoplasm: Secondary | ICD-10-CM | POA: Diagnosis not present

## 2023-05-01 DIAGNOSIS — Z86010 Personal history of colonic polyps: Secondary | ICD-10-CM

## 2023-05-01 DIAGNOSIS — Z1211 Encounter for screening for malignant neoplasm of colon: Secondary | ICD-10-CM | POA: Diagnosis not present

## 2023-05-01 MED ORDER — SODIUM CHLORIDE 0.9 % IV SOLN
500.0000 mL | Freq: Once | INTRAVENOUS | Status: DC
Start: 2023-05-01 — End: 2023-05-01

## 2023-05-01 NOTE — Progress Notes (Unsigned)
GASTROENTEROLOGY PROCEDURE H&P NOTE   Primary Care Physician: Sheliah Hatch, MD    Reason for Procedure:   history of colon polyps  Plan:    Colonoscopy  Patient is appropriate for endoscopic procedure(s) in the ambulatory ([facility]) setting.  The nature of the procedure, as well as the risks, benefits, and alternatives were carefully and thoroughly reviewed with the patient. Ample time for discussion and questions allowed. The patient understood, was satisfied, and agreed to proceed.     HPI: Joanne Li is a 62 y.o. female who presents for surveillance colonoscopy pain.  Medical history as below.  Tolerated the prep.  No recent chest pain or shortness of breath.  No abdominal pain today.  Past Medical History:  Diagnosis Date   Anxiety    Arthritis    Cataract    Heart murmur    Hyperlipidemia    Insomnia    MVP (mitral valve prolapse)    Palpitations    Sleep walking     Past Surgical History:  Procedure Laterality Date   COLONOSCOPY  2013   SHOULDER SURGERY  2009   right    Prior to Admission medications   Medication Sig Start Date End Date Taking? Authorizing Provider  ALPRAZolam Prudy Feeler) 0.25 MG tablet TAKE 2 TABLETS TWICE A DAY AS NEEDED FOR ANXIETY 01/26/23  Yes Sheliah Hatch, MD  Cholecalciferol (VITAMIN D3 PO) Take by mouth.   Yes [provider]  Loratadine 10 MG CAPS Take by mouth.   Yes [provider]  Omega-3 Fatty Acids (FISH OIL) 1000 MG CAPS Take 1,000 mg by mouth daily.   Yes [provider]  rosuvastatin (CRESTOR) 20 MG tablet TAKE 1 TABLET BY MOUTH EVERY DAY 02/23/23  Yes Sheliah Hatch, MD  EPINEPHrine 0.3 mg/0.3 mL IJ SOAJ injection Inject 0.3 mLs (0.3 mg total) into the muscle once. Reported on 04/25/2016 Patient not taking: Reported on 04/06/2023 04/25/16   Sheliah Hatch, MD  mometasone (NASONEX) 50 MCG/ACT nasal spray Place 2 sprays into the nose daily. 01/26/23   Sheliah Hatch, MD     Current Outpatient Medications  Medication Sig Dispense Refill   ALPRAZolam (XANAX) 0.25 MG tablet TAKE 2 TABLETS TWICE A DAY AS NEEDED FOR ANXIETY 120 tablet 1   Cholecalciferol (VITAMIN D3 PO) Take by mouth.     Loratadine 10 MG CAPS Take by mouth.     Omega-3 Fatty Acids (FISH OIL) 1000 MG CAPS Take 1,000 mg by mouth daily.     rosuvastatin (CRESTOR) 20 MG tablet TAKE 1 TABLET BY MOUTH EVERY DAY 90 tablet 1   EPINEPHrine 0.3 mg/0.3 mL IJ SOAJ injection Inject 0.3 mLs (0.3 mg total) into the muscle once. Reported on 04/25/2016 (Patient not taking: Reported on 04/06/2023) 1 Device 3   mometasone (NASONEX) 50 MCG/ACT nasal spray Place 2 sprays into the nose daily. 1 each 12   Current Facility-Administered Medications  Medication Dose Route Frequency Provider Last Rate Last Admin   0.9 %  sodium chloride infusion  500 mL Intravenous Once Areeba Sulser, Carie Caddy, MD        Allergies as of 05/01/2023 - Review Complete 05/01/2023  Allergen Reaction Noted   Metronidazole Nausea And Vomiting 09/03/2014   Nitrofurantoin Nausea And Vomiting 05/25/2009   Wheat Hives 09/09/2016    Family History  Problem Relation Age of Onset   Arthritis Mother    Arthritis Father    Coronary artery disease Father  s/p quadruple bypass   Colon polyps Sister    Colon cancer Neg Hx    Esophageal cancer Neg Hx    Stomach cancer Neg Hx    Rectal cancer Neg Hx     Social History   Socioeconomic History   Marital status: Married    Spouse name: Not on file   Number of children: 1   Years of education: Not on file   Highest education level: Not on file  Occupational History   Occupation: Nutritional therapist - chef  Tobacco Use   Smoking status: Never   Smokeless tobacco: Never  Vaping Use   Vaping Use: Never used  Substance and Sexual Activity   Alcohol use: Yes    Alcohol/week: 0.0 standard drinks of alcohol    Comment: occasionally per pt/1 drink per month   Drug use: No   Sexual activity:  Not on file  Other Topics Concern   Not on file  Social History Narrative   Not on file   Social Determinants of Health   Financial Resource Strain: Not on file  Food Insecurity: No Food Insecurity (04/03/2023)   Hunger Vital Sign    Worried About Running Out of Food in the Last Year: Never true    Ran Out of Food in the Last Year: Never true  Transportation Needs: No Transportation Needs (04/03/2023)   PRAPARE - Administrator, Civil Service (Medical): No    Lack of Transportation (Non-Medical): No  Physical Activity: Not on file  Stress: Stress Concern Present (04/03/2023)   [employer] of Occupational Health - Occupational Stress Questionnaire    Feeling of Stress : To some extent  Social Connections: Unknown (04/03/2023)   Social Connection and Isolation Panel [NHANES]    Frequency of Communication with Friends and Family: More than three times a week    Frequency of Social Gatherings with Friends and Family: Once a week    Attends Religious Services: Patient declined    Database administrator or Organizations: No    Attends Engineer, structural: Not on file    Marital Status: Not on file  Intimate Partner Violence: Not on file    Physical Exam: Vital signs in last 24 hours: @BP  127/78   Pulse 72   Temp 97.8 F (36.6 C) (Temporal)   Ht 5\' 5"  (1.651 m)   Wt 225 lb (102.1 kg)   LMP 08/26/2014   SpO2 98%   BMI 37.44 kg/m  GEN: NAD EYE: Sclerae anicteric ENT: MMM CV: Non-tachycardic Pulm: CTA b/l GI: Soft, NT/ND NEURO:  Alert & Oriented x 3   Erick Blinks, MD [facility]  05/01/2023 3:31 PM

## 2023-05-01 NOTE — Progress Notes (Unsigned)
VS completed by DT.  Pt's states no medical or surgical changes since previsit or office visit.  

## 2023-05-01 NOTE — Op Note (Signed)
[facility] Patient Name: Joanne Li Procedure Date: 05/01/2023 3:39 PM MRN: [id number] Endoscopist: Beverley Fiedler , MD, [id number] Age: 62 Referring MD:  Date of Birth: Feb 28, 1961 Gender: Female Account #: [id number] Procedure:                Colonoscopy Indications:              High risk colon cancer surveillance: Personal                            history of non-advanced adenomas, Last colonoscopy:                            March 2019 (2 adenomas); 2013 (1 adenoma) Medicines:                Monitored Anesthesia Care Procedure:                Pre-Anesthesia Assessment:                           - Prior to the procedure, a History and Physical                            was performed, and patient medications and                            allergies were reviewed. The patient's tolerance of                            previous anesthesia was also reviewed. The risks                            and benefits of the procedure and the sedation                            options and risks were discussed with the patient.                            All questions were answered, and informed consent                            was obtained. Prior Anticoagulants: The patient has                            taken no anticoagulant or antiplatelet agents. ASA                            Grade Assessment: II - A patient with mild systemic                            disease. After reviewing the risks and benefits,                            the patient was deemed in satisfactory condition to  undergo the procedure.                           After obtaining informed consent, the colonoscope                            was passed under direct vision. Throughout the                            procedure, the patient's blood pressure, pulse, and                            oxygen saturations were monitored continuously. The                            Olympus PCF-[id number]  (#[id number]) Colonoscope was                            introduced through the anus and advanced to the                            cecum, identified by appendiceal orifice and                            ileocecal valve. The colonoscopy was performed                            without difficulty. The patient tolerated the                            procedure well. The quality of the bowel                            preparation was excellent. The ileocecal valve,                            appendiceal orifice, and rectum were photographed. Scope In: 3:55:15 PM Scope Out: 4:06:49 PM Scope Withdrawal Time: 0 hours 9 minutes 47 seconds  Total Procedure Duration: 0 hours 11 minutes 34 seconds  Findings:                 The digital rectal exam was normal.                           The entire examined colon appeared normal on direct                            and retroflexion views. Complications:            No immediate complications. Estimated Blood Loss:     Estimated blood loss: none. Impression:               - The entire examined colon is normal on direct and                            retroflexion views.                           -  No specimens collected. Recommendation:           - Patient has a contact number available for                            emergencies. The signs and symptoms of potential                            delayed complications were discussed with the                            patient. Return to normal activities tomorrow.                            Written discharge instructions were provided to the                            patient.                           - Resume previous diet.                           - Continue present medications.                           - Repeat colonoscopy in 10 years for surveillance. Beverley Fiedler, MD 05/01/2023 4:08:49 PM This report has been signed electronically.

## 2023-05-01 NOTE — Patient Instructions (Signed)
Resume previous diet Continue present medications There were no colon polyps seen today!   You will need another screening colonoscopy in 10 years. you will receive a letter at that time when you are due for the procedure. Please call us at [phone] if you have a change in bowel habits, change in family history of colo-rectal cancer, rectal bleeding or other GI concern before that time.  YOU HAD AN ENDOSCOPIC PROCEDURE TODAY AT THE [facility]:   Refer to the procedure report that was given to you for any specific questions about what was found during the examination.  If the procedure report does not answer your questions, please call your gastroenterologist to clarify.  If you requested that your care partner not be given the details of your procedure findings, then the procedure report has been included in a sealed envelope for you to review at your convenience later.  YOU SHOULD EXPECT: Some feelings of bloating in the abdomen. Passage of more gas than usual.  Walking can help get rid of the air that was put into your GI tract during the procedure and reduce the bloating. If you had a lower endoscopy (such as a colonoscopy or flexible sigmoidoscopy) you may notice spotting of blood in your stool or on the toilet paper. If you underwent a bowel prep for your procedure, you may not have a normal bowel movement for a few days. Please Note:  You might notice some irritation and congestion in your nose or some drainage.  This is from the oxygen used during your procedure.  There is no need for concern and it should clear up in a day or so.  SYMPTOMS TO REPORT IMMEDIATELY:  Following lower endoscopy (colonoscopy):  Excessive amounts of blood in the stool  Significant tenderness or worsening of abdominal pains  Swelling of the abdomen that is new, acute  Fever of 100F or higher  For urgent or emergent issues, a gastroenterologist can be reached at any hour by calling [phone]  [phone]. Do not use MyChart messaging for urgent concerns.   DIET:  We do recommend a small meal at first, but then you may proceed to your regular diet.  Drink plenty of fluids but you should avoid alcoholic beverages for 24 hours.  ACTIVITY:  You should plan to take it easy for the rest of today and you should NOT DRIVE or use heavy machinery until tomorrow (because of the sedation medicines used during the test).    FOLLOW UP: Our staff will call the number listed on your records the next business day following your procedure.  We will call around 7:15- 8:00 am to check on you and address any questions or concerns that you may have regarding the information given to you following your procedure. If we do not reach you, we will leave a message.     SIGNATURES/CONFIDENTIALITY: You and/or your care partner have signed paperwork which will be entered into your electronic medical record.  These signatures attest to the fact that that the information above on your After Visit Summary has been reviewed and is understood.  Full responsibility of the confidentiality of this discharge information lies with you and/or your care-partner.

## 2023-05-01 NOTE — Progress Notes (Signed)
Pt awake, alert and oriented. VSS. Airway intact. SBAR complete to RN. All questions answered.   

## 2023-05-02 ENCOUNTER — Telehealth: Payer: Self-pay | Admitting: *Deleted

## 2023-05-02 NOTE — Telephone Encounter (Signed)
Attempted to call patient for their post-procedure follow-up call. No answer. Left voicemail.   

## 2023-06-13 ENCOUNTER — Telehealth: Payer: Self-pay | Admitting: Internal Medicine

## 2023-06-13 NOTE — Telephone Encounter (Signed)
Inbound call from patient requesting to speak with a nurse or provider in regards to her procedure being coded as high risk. Patient states she has already spoken with billing and they told her there was nothing further they could do.  Please advise.  Thank you

## 2023-06-13 NOTE — Telephone Encounter (Signed)
I spoke with the patient and explained she has a history of colon polyps and she was mailed a letter in April stating her $1500 deductible and that her procedure was being coded as a high risk screening. I sent her a copy of the letter in her MyChart and copy of her instructions that state her financial responsibilities and the definitions of screening procedures.  She will call back if she has any questions or concerns

## 2023-06-13 NOTE — Telephone Encounter (Signed)
Discussed with pt that she had precancerous polyps on her past 2 colonoscopies and that her colon is considered high risk/diagnostic. Explained it cannot be coded as screening due to her history of tubular adenomas. Pt upset and wants to know why she was not told the amount she had to pay was so high. Explained I am one of the triage nurses and do not have anything to do with billing but I would be happy to ask my manager to call her.

## 2023-07-13 ENCOUNTER — Encounter: Payer: Self-pay | Admitting: Podiatry

## 2023-07-13 ENCOUNTER — Ambulatory Visit: Payer: BC Managed Care – PPO | Admitting: Podiatry

## 2023-07-13 DIAGNOSIS — M7751 Other enthesopathy of right foot: Secondary | ICD-10-CM | POA: Diagnosis not present

## 2023-07-13 DIAGNOSIS — M7752 Other enthesopathy of left foot: Secondary | ICD-10-CM

## 2023-07-13 MED ORDER — TRIAMCINOLONE ACETONIDE 10 MG/ML IJ SUSP
10.0000 mg | Freq: Once | INTRAMUSCULAR | Status: AC
Start: 2023-07-13 — End: 2023-07-13
  Administered 2023-07-13: 10 mg via INTRA_ARTICULAR

## 2023-07-15 NOTE — Progress Notes (Signed)
Subjective:   Patient ID: Joanne Li, female   DOB: 62 y.o.   MRN: [id number]   HPI Patient is presented a lot of pain in the fifth metatarsal heads of both feet and states that this is started over the last number of months with lesion formation   ROS      Objective:  Physical Exam  Neurovascular status intact muscle strength adequate patient noted to have inflammation pain with fluid buildup around the fifth metatarsal head bilateral     Assessment:  Inflammatory capsulitis fifth metatarsal bilateral with fluid buildup     Plan:  HEP reviewed and at this point I went ahead did sterile prep I injected around the fifth MPJ bilateral 3 mg dexamethasone Kenalog 5 mg Xylocaine applied padding to try to take pressure off of the debrided lesions courtesy.  Reappoint to recheck

## 2023-07-19 DIAGNOSIS — R87610 Atypical squamous cells of undetermined significance on cytologic smear of cervix (ASC-US): Secondary | ICD-10-CM | POA: Diagnosis not present

## 2023-07-19 DIAGNOSIS — Z01419 Encounter for gynecological examination (general) (routine) without abnormal findings: Secondary | ICD-10-CM | POA: Diagnosis not present

## 2023-07-30 ENCOUNTER — Other Ambulatory Visit: Payer: Self-pay | Admitting: Family Medicine

## 2023-07-30 DIAGNOSIS — F411 Generalized anxiety disorder: Secondary | ICD-10-CM

## 2023-07-31 NOTE — Telephone Encounter (Signed)
Xanax 0.25 mg Requested Prescriptions   Pending Prescriptions Disp Refills   ALPRAZolam (XANAX) 0.25 MG tablet [Pharmacy Med Name: ALPRAZOLAM 0.25 MG TABLET] 120 tablet     Sig: TAKE 2 TABLETS TWICE A DAY AS NEEDED FOR ANXIETY     Date of patient request: 07/31/23 Last office visit: 04/03/23 Date of last refill: 01/26/23 Last refill amount: 120 Follow up time period per chart: PRN

## 2023-08-23 ENCOUNTER — Telehealth: Payer: BC Managed Care – PPO | Admitting: Physician Assistant

## 2023-08-23 ENCOUNTER — Telehealth: Payer: BC Managed Care – PPO | Admitting: Emergency Medicine

## 2023-08-23 DIAGNOSIS — U071 COVID-19: Secondary | ICD-10-CM | POA: Diagnosis not present

## 2023-08-23 MED ORDER — BENZONATATE 100 MG PO CAPS: 100.0000 mg | ORAL_CAPSULE | Freq: Two times a day (BID) | ORAL | 0 refills | Status: DC | PRN

## 2023-08-23 MED ORDER — MOLNUPIRAVIR EUA 200MG CAPSULE: 4.0000 | ORAL_CAPSULE | Freq: Two times a day (BID) | ORAL | 0 refills | Status: AC

## 2023-08-23 NOTE — Progress Notes (Signed)
Virtual Visit Consent   Joanne Li, you are scheduled for a virtual visit with a [facility] provider today. Just as with appointments in the office, your consent must be obtained to participate. Your consent will be active for this visit and any virtual visit you may have with one of our providers in the next 365 days. If you have a MyChart account, a copy of this consent can be sent to you electronically.  As this is a virtual visit, video technology does not allow for your provider to perform a traditional examination. This may limit your provider's ability to fully assess your condition. If your provider identifies any concerns that need to be evaluated in person or the need to arrange testing (such as labs, EKG, etc.), we will make arrangements to do so. Although advances in technology are sophisticated, we cannot ensure that it will always work on either your end or our end. If the connection with a video visit is poor, the visit may have to be switched to a telephone visit. With either a video or telephone visit, we are not always able to ensure that we have a secure connection.  By engaging in this virtual visit, you consent to the provision of healthcare and authorize for your insurance to be billed (if applicable) for the services provided during this visit. Depending on your insurance coverage, you may receive a charge related to this service.  I need to obtain your verbal consent now. Are you willing to proceed with your visit today? Shawntai Gaur has provided verbal consent on 08/23/2023 for a virtual visit (video or telephone). Roxy Horseman, PA-C  Date: 08/23/2023 11:14 AM  Virtual Visit via Video Note   I, Roxy Horseman, connected with  Kelly Tyson  ([id number], [birth year]) on 08/23/23 at 11:15 AM EDT by a video-enabled telemedicine application and verified that I am speaking with the correct person using two identifiers.  Location: Patient: Virtual Visit Location Patient:  Home Provider: Virtual Visit Location Provider: Home Office   I discussed the limitations of evaluation and management by telemedicine and the availability of in person appointments. The patient expressed understanding and agreed to proceed.    History of Present Illness: Joanne Li is a 62 y.o. who identifies as a female who was assigned female at birth, and is being seen today for fevers, chills, cough, fatigue.  States that she has been running fever to 100.9.  States that she is having productive cough.  States that she had a positive covid test.  Onset of symptoms was 2 days ago.  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   ROTATOR CUFF SYNDROME, RIGHT 01/29/2008    Allergies:  Allergies  Allergen Reactions   Metronidazole Nausea And Vomiting    Upset stomach Upset stomach   Nitrofurantoin Nausea And Vomiting    REACTION: sick to stomach REACTION: sick to stomach   Wheat Hives    On Body, Face and soles of feet  Medications:  Current Outpatient Medications:    ALPRAZolam (XANAX) 0.25 MG tablet, TAKE 2 TABLETS TWICE A DAY AS NEEDED FOR ANXIETY, Disp: 120 tablet, Rfl: 0   Cholecalciferol (VITAMIN D3 PO), Take by mouth., Disp: , Rfl:    EPINEPHrine 0.3 mg/0.3 mL IJ SOAJ injection, Inject 0.3 mLs (0.3 mg total) into the muscle once. Reported on 04/25/2016 (Patient not taking: Reported on 04/06/2023), Disp: 1 Device, Rfl: 3   Loratadine 10 MG  CAPS, Take by mouth., Disp: , Rfl:    mometasone (NASONEX) 50 MCG/ACT nasal spray, Place 2 sprays into the nose daily., Disp: 1 each, Rfl: 12   Omega-3 Fatty Acids (FISH OIL) 1000 MG CAPS, Take 1,000 mg by mouth daily., Disp: , Rfl:    rosuvastatin (CRESTOR) 20 MG tablet, TAKE 1 TABLET BY MOUTH EVERY DAY, Disp: 90 tablet, Rfl: 1  Observations/Objective: Patient is well-developed, well-nourished in no acute distress.  Resting comfortably at home.  Head is normocephalic, atraumatic.  No labored breathing. Speech is clear and coherent with logical content.  Patient is alert and oriented at baseline.   Assessment and Plan: There are no diagnoses linked to this encounter.  Meds ordered this encounter  Medications   molnupiravir EUA (LAGEVRIO) 200 mg CAPS capsule    Sig: Take 4 capsules (800 mg total) by mouth 2 (two) times daily for 5 days.    Dispense:  40 capsule    Refill:  0    Order Specific Question:   Supervising Provider    Answer:   Merrilee Jansky [[id number]]   benzonatate (TESSALON) 100 MG capsule    Sig: Take 1 capsule (100 mg total) by mouth 2 (two) times daily as needed for cough.    Dispense:  20 capsule    Refill:  0    Order Specific Question:   Supervising Provider    Answer:   Merrilee Jansky [id number]     Follow Up Instructions: I discussed the assessment and treatment plan with the patient. The patient was provided an opportunity to ask questions and all were answered. The patient agreed with the plan and demonstrated an understanding of the instructions.  A copy of instructions were sent to the patient via MyChart unless otherwise noted below.     The patient was advised to call back or seek an in-person evaluation if the symptoms worsen or if the condition fails to improve as anticipated.  Time:  I spent 12 minutes with the patient via telehealth technology discussing the above problems/concerns.    Roxy Horseman, PA-C

## 2023-08-23 NOTE — Progress Notes (Signed)
   Thank you for the details you included in the comment boxes. Those details are very helpful in determining the best course of treatment for you and help [place] to provide the best care.Because of worsening COVID symptoms and need to discuss antivirals and other treatments in more detail, we recommend that you convert this visit to a video visit in order for the provider to better assess what is going on.  The provider will be able to give you a more accurate diagnosis and treatment plan if we can more freely discuss your symptoms and with the addition of a virtual examination.   If you convert to a video visit, we will bill your insurance (similar to an office visit) and you will not be charged for this e-Visit. You will be able to stay at home and speak with the first available [facility] advanced practice provider. The link to do a video visit is in the drop down Menu tab of your Welcome screen in MyChart.

## 2023-08-23 NOTE — Patient Instructions (Signed)
Joanne Li, thank you for joining Joanne Horseman, PA-C for today's virtual visit.  While this provider is not your primary care provider (PCP), if your PCP is located in our provider database this encounter information will be shared with them immediately following your visit.   A [facility] MyChart account gives you access to today's visit and all your visits, tests, and labs performed at [facility] " click here if you don't have a [facility] MyChart account or go to [email or web address]  Consent: (Patient) Joanne Li provided verbal consent for this virtual visit at the beginning of the encounter.  Current Medications:  Current Outpatient Medications:    benzonatate (TESSALON) 100 MG capsule, Take 1 capsule (100 mg total) by mouth 2 (two) times daily as needed for cough., Disp: 20 capsule, Rfl: 0   molnupiravir EUA (LAGEVRIO) 200 mg CAPS capsule, Take 4 capsules (800 mg total) by mouth 2 (two) times daily for 5 days., Disp: 40 capsule, Rfl: 0   ALPRAZolam (XANAX) 0.25 MG tablet, TAKE 2 TABLETS TWICE A DAY AS NEEDED FOR ANXIETY, Disp: 120 tablet, Rfl: 0   Cholecalciferol (VITAMIN D3 PO), Take by mouth., Disp: , Rfl:    EPINEPHrine 0.3 mg/0.3 mL IJ SOAJ injection, Inject 0.3 mLs (0.3 mg total) into the muscle once. Reported on 04/25/2016 (Patient not taking: Reported on 04/06/2023), Disp: 1 Device, Rfl: 3   Loratadine 10 MG CAPS, Take by mouth., Disp: , Rfl:    mometasone (NASONEX) 50 MCG/ACT nasal spray, Place 2 sprays into the nose daily., Disp: 1 each, Rfl: 12   Omega-3 Fatty Acids (FISH OIL) 1000 MG CAPS, Take 1,000 mg by mouth daily., Disp: , Rfl:    rosuvastatin (CRESTOR) 20 MG tablet, TAKE 1 TABLET BY MOUTH EVERY DAY, Disp: 90 tablet, Rfl: 1   Medications ordered in this encounter:  Meds ordered this encounter  Medications   molnupiravir EUA (LAGEVRIO) 200 mg CAPS capsule    Sig: Take 4 capsules (800 mg total) by mouth 2 (two) times daily for 5 days.     Dispense:  40 capsule    Refill:  0    Order Specific Question:   Supervising Provider    Answer:   Merrilee Jansky [[id number]]   benzonatate (TESSALON) 100 MG capsule    Sig: Take 1 capsule (100 mg total) by mouth 2 (two) times daily as needed for cough.    Dispense:  20 capsule    Refill:  0    Order Specific Question:   Supervising Provider    Answer:   Merrilee Jansky [id number]     *If you need refills on other medications prior to your next appointment, please contact your pharmacy*  Follow-Up: Call back or seek an in-person evaluation if the symptoms worsen or if the condition fails to improve as anticipated.  [facility] [phone]  Other Instructions    If you have been instructed to have an in-person evaluation today at a local Urgent Care facility, please use the link below. It will take you to a list of all of our available [facility], including address, phone number and hours of operation. Please do not delay care.  [facility]  If you or a family member do not have a primary care provider, use the link below to schedule a visit and establish care. When you choose a  primary care physician or advanced practice provider, you gain a long-term partner in health.  Find a Primary Care Provider  Learn more about [facility]'s in-office and virtual care options: [facility] - Get Care Now

## 2023-09-03 ENCOUNTER — Other Ambulatory Visit: Payer: Self-pay | Admitting: Family Medicine

## 2023-09-03 DIAGNOSIS — E785 Hyperlipidemia, unspecified: Secondary | ICD-10-CM

## 2023-10-16 ENCOUNTER — Ambulatory Visit
Admission: RE | Admit: 2023-10-16 | Discharge: 2023-10-16 | Disposition: A | Discharge status: Home / Self Care | Payer: BC Managed Care – PPO | Source: Ambulatory Visit | Attending: Obstetrics & Gynecology | Admitting: Obstetrics & Gynecology

## 2023-10-16 DIAGNOSIS — Z1231 Encounter for screening mammogram for malignant neoplasm of breast: Secondary | ICD-10-CM | POA: Diagnosis not present

## 2023-10-17 ENCOUNTER — Other Ambulatory Visit: Payer: Self-pay | Admitting: Family Medicine

## 2023-10-17 DIAGNOSIS — E669 Obesity, unspecified: Secondary | ICD-10-CM

## 2023-10-17 NOTE — Progress Notes (Signed)
Referral placed as requested.

## 2023-10-24 ENCOUNTER — Ambulatory Visit: Payer: BC Managed Care – PPO | Admitting: Family Medicine

## 2023-10-30 ENCOUNTER — Ambulatory Visit: Payer: BC Managed Care – PPO | Admitting: Family Medicine

## 2023-10-30 ENCOUNTER — Encounter: Payer: Self-pay | Admitting: Family Medicine

## 2023-10-30 ENCOUNTER — Telehealth: Payer: Self-pay | Admitting: Family Medicine

## 2023-10-30 VITALS — BP 118/72 | HR 91 | Temp 97.7°F | Ht 65.0 in | Wt 216.2 lb

## 2023-10-30 DIAGNOSIS — R051 Acute cough: Secondary | ICD-10-CM

## 2023-10-30 LAB — POC COVID19 BINAXNOW: SARS Coronavirus 2 Ag: NEGATIVE

## 2023-10-30 MED ORDER — AZITHROMYCIN 250 MG PO TABS: ORAL_TABLET | ORAL | 0 refills | Status: AC

## 2023-10-30 MED ORDER — BENZONATATE 200 MG PO CAPS: 200.0000 mg | ORAL_CAPSULE | Freq: Two times a day (BID) | ORAL | 0 refills | Status: DC | PRN

## 2023-10-30 NOTE — Patient Instructions (Signed)
Follow up as needed or as scheduled START the Zpack today as directed Drink LOTS of fluids REST!! Robitussin or Delsym as needed for cough ADD the cough pills to whatever cough meds you are taking Call with any questions or concerns Hang in there!!!

## 2023-10-30 NOTE — Progress Notes (Signed)
   Subjective:    Patient ID: Joanne Li, female    DOB: 1961-07-08, 62 y.o.   MRN: [id number]  HPI URI- 'sinus congestion, that terrible cough at night'.  Sxs started Thursday.  + mild sore throat.  Taking Dayquil and Nyquil w/o relief.  Last URI found good relief w/ tessalon.  + sick contacts- works in a school.  Denies HA, facial pain, tooth pain.  + hoarseness.  Cough is productive of sputum- blood at times.     Review of Systems For ROS see HPI     Objective:   Physical Exam Vitals reviewed.  Constitutional:      General: She is not in acute distress.    Appearance: Normal appearance. She is well-developed. She is obese. She is not ill-appearing.  HENT:     Head: Normocephalic and atraumatic.     Right Ear: Tympanic membrane and ear canal normal.     Left Ear: Tympanic membrane and ear canal normal.     Nose: No congestion.     Comments: No TTP over sinuses Eyes:     Extraocular Movements: EOM normal.     Conjunctiva/sclera: Conjunctivae normal.     Pupils: Pupils are equal, round, and reactive to light.  Cardiovascular:     Rate and Rhythm: Normal rate and regular rhythm.     Heart sounds: Normal heart sounds. No murmur heard. Pulmonary:     Effort: Pulmonary effort is normal. No respiratory distress.     Breath sounds: Normal breath sounds. No wheezing or rhonchi.     Comments: No cough heard Musculoskeletal:     Cervical back: Normal range of motion and neck supple.  Lymphadenopathy:     Cervical: No cervical adenopathy.  Neurological:     General: No focal deficit present.     Mental Status: She is alert and oriented to person, place, and time.  Psychiatric:        Mood and Affect: Mood normal.        Behavior: Behavior normal.        Thought Content: Thought content normal.           Assessment & Plan:   Acute cough- new.  Pt reports intermittent bloody sputum.  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  Tessalon prn.  Reviewed supportive care and red flags that should prompt return.  Pt expressed understanding and is in agreement w/ plan.

## 2023-10-30 NOTE — Telephone Encounter (Signed)
Noted . Pt will be seen this afternoon.

## 2023-10-30 NOTE — Telephone Encounter (Signed)
Caller name: Sharane Deibel  On DPR?: Yes  Call back number: [phone] (mobile)  Provider they see: Sheliah Hatch, MD  Reason for call: Pt was returning Tonya's phone call regarding MyChart message about a medication being sent in. She has been scheduled this afternoon at 2:40pm

## 2023-11-30 ENCOUNTER — Ambulatory Visit: Payer: BC Managed Care – PPO | Admitting: Podiatry

## 2023-11-30 ENCOUNTER — Encounter: Payer: Self-pay | Admitting: Podiatry

## 2023-11-30 DIAGNOSIS — M21622 Bunionette of left foot: Secondary | ICD-10-CM | POA: Diagnosis not present

## 2023-11-30 DIAGNOSIS — D367 Benign neoplasm of other specified sites: Secondary | ICD-10-CM | POA: Diagnosis not present

## 2023-12-01 ENCOUNTER — Telehealth: Payer: Self-pay | Admitting: Podiatry

## 2023-12-01 NOTE — Progress Notes (Signed)
Subjective:   Patient ID: Joanne Li, female   DOB: 62 y.o.   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  I did review this with her and she agrees and will get a try to get it in by the end of the year due to her insurance and at this time I allowed her to read consent form going over line by line fifth metatarsal head resection along with excision of benign neoplasm of plantar with wide excision.  After review patient signed consent form scheduled for outpatient surgery and I did dispense air fracture walker for wearing now to reduce the inflammation explaining how to do properly fitted to the lower leg

## 2023-12-01 NOTE — Telephone Encounter (Signed)
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  CALL REFERENCE #: CERVANTES L 12/01/23 @2 :12 PM EST

## 2023-12-04 ENCOUNTER — Telehealth: Payer: Self-pay

## 2023-12-04 NOTE — Telephone Encounter (Signed)
Patient needs to know how much recovery time she will need so that she can inform her employer.

## 2023-12-07 ENCOUNTER — Telehealth: Payer: Self-pay

## 2023-12-07 NOTE — Telephone Encounter (Signed)
Patient called stating that she will needs a referral for her post operative care because of Jan. 1,2025 our office will be out of network with her insurance company.

## 2023-12-08 NOTE — Telephone Encounter (Signed)
I will take care of her on the 6th with no charge and will be able to see her during her postop with no charge

## 2023-12-08 NOTE — Telephone Encounter (Signed)
I spoke with patient and she asked to be out for 2 weeks. She works in [employer]nel [employer] so her job does require her to be on her feet. Patient also stated that she never received a call about the final cost of surgery.

## 2023-12-08 NOTE — Telephone Encounter (Signed)
Chip Boer should be calling her. 2 weeks is fine

## 2023-12-08 NOTE — Telephone Encounter (Signed)
I spoke with patient and she verbalized her understanding.

## 2023-12-08 NOTE — Telephone Encounter (Signed)
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 Week. If stands will be around 4 weeks

## 2023-12-11 MED ORDER — HYDROCODONE-ACETAMINOPHEN 10-325 MG PO TABS
1.0000 | ORAL_TABLET | Freq: Three times a day (TID) | ORAL | 0 refills | Status: AC | PRN
Start: 2023-12-11 — End: 2023-12-16

## 2023-12-11 NOTE — Addendum Note (Signed)
Addended by: Lenn Sink on: 12/11/2023 12:48 PM   Modules accepted: Orders

## 2023-12-12 DIAGNOSIS — M2012 Hallux valgus (acquired), left foot: Secondary | ICD-10-CM | POA: Diagnosis not present

## 2023-12-12 DIAGNOSIS — I341 Nonrheumatic mitral (valve) prolapse: Secondary | ICD-10-CM | POA: Diagnosis not present

## 2023-12-12 DIAGNOSIS — D492 Neoplasm of unspecified behavior of bone, soft tissue, and skin: Secondary | ICD-10-CM | POA: Diagnosis not present

## 2023-12-12 DIAGNOSIS — M21611 Bunion of right foot: Secondary | ICD-10-CM | POA: Diagnosis not present

## 2023-12-12 DIAGNOSIS — M21622 Bunionette of left foot: Secondary | ICD-10-CM | POA: Diagnosis not present

## 2023-12-12 DIAGNOSIS — D2372 Other benign neoplasm of skin of left lower limb, including hip: Secondary | ICD-10-CM | POA: Diagnosis not present

## 2023-12-15 ENCOUNTER — Telehealth: Payer: Self-pay

## 2023-12-15 NOTE — Telephone Encounter (Signed)
 Patient instructions include WBAT - she is concerned about how much she can be on it, afraid to put too much weight on that foot - how much is too much?

## 2023-12-18 ENCOUNTER — Ambulatory Visit (INDEPENDENT_AMBULATORY_CARE_PROVIDER_SITE_OTHER): Payer: BLUE CROSS/BLUE SHIELD | Admitting: Podiatry

## 2023-12-18 ENCOUNTER — Ambulatory Visit (INDEPENDENT_AMBULATORY_CARE_PROVIDER_SITE_OTHER): Payer: BLUE CROSS/BLUE SHIELD

## 2023-12-18 DIAGNOSIS — M21622 Bunionette of left foot: Secondary | ICD-10-CM

## 2023-12-20 ENCOUNTER — Telehealth: Payer: Self-pay

## 2023-12-20 NOTE — Progress Notes (Signed)
 Subjective:   Patient ID: Joanne Li, female   DOB: 63 y.o.   MRN: [id number]   HPI Patient states she is doing excellent with surgery very pleased neurovascular   ROS      Objective:  Physical Exam  That is intact negative [place]' sign noted wound edges left healed well both dorsal and plantar with no drainage noted mild discomfort consistent with recovery     Assessment:  Doing well post fifth metatarsal head resection and wide excision lesion left     Plan:  H&P reviewed reapplied sterile dressing applied walking boot and gradual surgical shoe which was dispensed reappoint to recheck again in 2 weeks suture removal or earlier if needed  X-rays indicate satisfactory resection head of fifth metatarsal left good stability

## 2023-12-20 NOTE — Telephone Encounter (Signed)
 Patient called and left a message. When can she go back to work? And should she be using neosporin on her incision?

## 2024-01-03 ENCOUNTER — Ambulatory Visit (INDEPENDENT_AMBULATORY_CARE_PROVIDER_SITE_OTHER): Payer: BLUE CROSS/BLUE SHIELD | Admitting: Podiatry

## 2024-01-03 ENCOUNTER — Ambulatory Visit (INDEPENDENT_AMBULATORY_CARE_PROVIDER_SITE_OTHER): Payer: BLUE CROSS/BLUE SHIELD

## 2024-01-03 ENCOUNTER — Encounter: Payer: Self-pay | Admitting: Podiatry

## 2024-01-03 VITALS — Ht 65.0 in | Wt 216.0 lb

## 2024-01-03 DIAGNOSIS — M21622 Bunionette of left foot: Secondary | ICD-10-CM

## 2024-01-03 NOTE — Progress Notes (Signed)
Subjective:   Patient ID: Joanne Li, female   DOB: 63 y.o.   MRN: [id number]   HPI Patient states I am doing very well very pleased with surgery walking well   ROS      Objective:  Physical Exam  Neurovascular status intact muscle strength found to be adequate range of motion adequate with patient noted to have good healing of the fifth metatarsal and plantar with stitches intact wound edges well coapted doing well     Assessment:  Post fifth metatarsal head resection left and resection of benign neoplasm     Plan:  H&P x-ray reviewed and went ahead today and took out all stitches wound edges well coapted reviewed x-ray dispensed ankle compression stocking and's can start to resume normal walking at this time.  Reappoint as symptoms indicate  X-rays indicate satisfactory resection head of fifth metatarsal good alignment noted

## 2024-02-05 ENCOUNTER — Ambulatory Visit
Admission: EM | Admit: 2024-02-05 | Discharge: 2024-02-05 | Disposition: A | Discharge status: Home / Self Care | Payer: BLUE CROSS/BLUE SHIELD | Attending: Physician Assistant | Admitting: Physician Assistant

## 2024-02-05 DIAGNOSIS — J069 Acute upper respiratory infection, unspecified: Secondary | ICD-10-CM | POA: Diagnosis not present

## 2024-02-05 LAB — POC COVID19/FLU A&B COMBO
Covid Antigen, POC: NEGATIVE
Influenza A Antigen, POC: NEGATIVE
Influenza B Antigen, POC: NEGATIVE

## 2024-02-05 MED ORDER — BENZONATATE 200 MG PO CAPS: 200.0000 mg | ORAL_CAPSULE | Freq: Two times a day (BID) | ORAL | 0 refills | Status: AC | PRN

## 2024-02-05 NOTE — ED Triage Notes (Signed)
 Patient presents to [facility] for sinus drainage, cough since Saturday. States treating with mucinex. States she is unable to lay flat in bed at night due to increased post nasal drip.   Denies fever.

## 2024-02-05 NOTE — ED Provider Notes (Signed)
 [facility]    CSN: [id number] Arrival date & time: 02/05/24  1533      History   Chief Complaint Chief Complaint  Patient presents with   Sinus Problem    HPI Joanne Li is a 63 y.o. female.   Patient here today for evaluation of sinus drainage, cough and congestion that started a few days ago. She has not had fever. She has tried OTC meds  (Mucinex) without improvement. She reports cough when she lies flat due to drainage.   The history is provided by the patient.  Sinus Problem Pertinent negatives include no abdominal pain and no shortness of breath.    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   Palpitations 12/04/2013   Physical exam 07/25/2011   Screening for malignant neoplasm of cervix 07/25/2011   Disordered sleep 07/25/2011   Colon cancer screening 07/25/2011   Hyperlipidemia 08/27/2010   Hearing loss 07/23/2010   GERD 07/23/2010   Anxiety state 05/25/2009   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 Authorizing Provider  ALPRAZolam Prudy Feeler) 0.25 MG tablet TAKE 2 TABLETS TWICE A DAY AS NEEDED FOR  ANXIETY 07/31/23   Sheliah Hatch, MD  azithromycin (ZITHROMAX) 250 MG tablet 2 tabs on day 1, 1 tab on day 2-5 10/30/23   Sheliah Hatch, MD  benzonatate (TESSALON) 200 MG capsule Take 1 capsule (200 mg total) by mouth 2 (two) times daily as needed for cough. 02/05/24   Tomi Bamberger, PA-C  Cholecalciferol (VITAMIN D3 PO) Take by mouth.    [provider]  EPINEPHrine 0.3 mg/0.3 mL IJ SOAJ injection Inject 0.3 mLs (0.3 mg total) into the muscle once. Reported on 04/25/2016 04/25/16   Sheliah Hatch, MD  Loratadine 10 MG CAPS Take by mouth.    [provider]  mometasone (NASONEX) 50 MCG/ACT nasal spray Place 2 sprays into the nose daily. 01/26/23   Sheliah Hatch, MD  Omega-3 Fatty Acids (FISH OIL) 1000 MG CAPS Take 1,000 mg by mouth daily.    [provider]  rosuvastatin (CRESTOR) 20 MG tablet TAKE 1 TABLET BY MOUTH EVERY DAY 09/04/23   Sheliah Hatch, MD  Family History Family History  Problem Relation Age of Onset   Arthritis Mother    Arthritis Father    Coronary artery disease Father        s/p quadruple bypass   Colon polyps Sister    Colon cancer Neg Hx    Esophageal cancer Neg Hx    Stomach cancer Neg Hx    Rectal cancer Neg Hx     Social History Social History   Tobacco Use   Smoking status: Never   Smokeless tobacco: Never  Vaping Use   Vaping status: Never Used  Substance Use Topics   Alcohol use: Yes    Alcohol/week: 0.0 standard drinks of alcohol    Comment: occasionally per pt/1 drink per month   Drug use: No     Allergies   Metronidazole, Nitrofurantoin, and Wheat   Review of Systems Review of Systems  Constitutional:  Negative for chills and fever.  HENT:  Positive for congestion and postnasal drip. Negative for ear pain.   Eyes:  Negative for discharge and redness.  Respiratory:  Positive for cough. Negative for shortness of breath and wheezing.   Gastrointestinal:  Negative for abdominal pain,  diarrhea, nausea and vomiting.     Physical Exam Triage Vital Signs ED Triage Vitals [02/05/24 1648]  Encounter Vitals Group     BP 130/86     Systolic BP Percentile      Diastolic BP Percentile      Pulse Rate 92     Resp 18     Temp      Temp Source Oral     SpO2 98 %     Weight      Height      Head Circumference      Peak Flow      Pain Score 0     Pain Loc      Pain Education      Exclude from Growth Chart    No data found.  Updated Vital Signs BP 130/86 (BP Location: Left Arm)   Pulse 92   Resp 18   LMP 08/26/2014   SpO2 98%   Visual Acuity Right Eye Distance:   Left Eye Distance:   Bilateral Distance:    Right Eye Near:   Left Eye Near:    Bilateral Near:     Physical Exam Vitals and nursing note reviewed.  Constitutional:      General: She is not in acute distress.    Appearance: Normal appearance. She is not ill-appearing.  HENT:     Head: Normocephalic and atraumatic.     Right Ear: Tympanic membrane normal.     Left Ear: Tympanic membrane normal.     Nose: Congestion present.     Mouth/Throat:     Mouth: Mucous membranes are moist.     Pharynx: No oropharyngeal exudate or posterior oropharyngeal erythema.  Eyes:     Conjunctiva/sclera: Conjunctivae normal.  Cardiovascular:     Rate and Rhythm: Normal rate and regular rhythm.     Heart sounds: Normal heart sounds. No murmur heard. Pulmonary:     Effort: Pulmonary effort is normal. No respiratory distress.     Breath sounds: Normal breath sounds. No wheezing, rhonchi or rales.  Skin:    General: Skin is warm and dry.  Neurological:     Mental Status: She is alert.  Psychiatric:        Mood and Affect: Mood normal.  Thought Content: Thought content normal.      UC Treatments / Results  Labs (all labs ordered are listed, but only abnormal results are displayed) Labs Reviewed  POC COVID19/FLU A&B COMBO - Normal    EKG   Radiology No results  found.  Procedures Procedures (including critical care time)  Medications Ordered in UC Medications - No data to display  Initial Impression / Assessment and Plan / UC Course  I have reviewed the triage vital signs and the nursing notes.  Pertinent labs & imaging results that were available during my care of the patient were reviewed by me and considered in my medical decision making (see chart for details).    Rapid flu and covid screening negative. Discussed symtpomatic treatment and patient reports tessalon pearls have worked well for her in the past. Encouraged increased fluids and rest and follow up if no improvement or with any further concerns.   Final Clinical Impressions(s) / UC Diagnoses   Final diagnoses:  Acute upper respiratory infection   Discharge Instructions   None    ED Prescriptions     Medication Sig Dispense Auth. Provider   benzonatate (TESSALON) 200 MG capsule Take 1 capsule (200 mg total) by mouth 2 (two) times daily as needed for cough. 45 capsule Tomi Bamberger, PA-C      PDMP not reviewed this encounter.   Tomi Bamberger, PA-C 02/05/24 [phone]
